# Patient Record
Sex: Female | Born: 1970 | Race: White | Hispanic: No | Smoking: Never smoker
Health system: Southern US, Community
[De-identification: ages and names within clinical notes are randomized; demographics above are authoritative.]

## PROBLEM LIST (undated history)

## (undated) DIAGNOSIS — E7800 Pure hypercholesterolemia, unspecified: Secondary | ICD-10-CM

## (undated) DIAGNOSIS — T7840XA Allergy, unspecified, initial encounter: Secondary | ICD-10-CM

## (undated) DIAGNOSIS — N6019 Diffuse cystic mastopathy of unspecified breast: Secondary | ICD-10-CM

## (undated) DIAGNOSIS — Z87898 Personal history of other specified conditions: Secondary | ICD-10-CM

## (undated) HISTORY — DX: Pure hypercholesterolemia, unspecified: E78.00

## (undated) HISTORY — DX: Diffuse cystic mastopathy of unspecified breast: N60.19

## (undated) HISTORY — DX: Allergy, unspecified, initial encounter: T78.40XA

## (undated) HISTORY — DX: Personal history of other specified conditions: Z87.898

---

## 2005-10-13 ENCOUNTER — Ambulatory Visit: Payer: Self-pay | Admitting: Obstetrics and Gynecology

## 2009-11-19 LAB — HM HIV SCREENING LAB: HM HIV Screening: NEGATIVE

## 2010-09-30 ENCOUNTER — Ambulatory Visit: Payer: Self-pay | Admitting: Obstetrics and Gynecology

## 2011-06-05 LAB — HEPATIC FUNCTION PANEL
Alkaline Phosphatase: 49 U/L (ref 25–125)
Bilirubin, Total: 0.6 mg/dL

## 2011-06-05 LAB — TSH: TSH: 2.21 u[IU]/mL (ref 0.41–5.90)

## 2011-06-05 LAB — BASIC METABOLIC PANEL
Creatinine: 0.8 mg/dL (ref 0.5–1.1)
Glucose: 85 mg/dL

## 2011-11-03 ENCOUNTER — Ambulatory Visit: Payer: Self-pay | Admitting: Obstetrics and Gynecology

## 2011-11-06 ENCOUNTER — Ambulatory Visit: Payer: Self-pay | Admitting: Obstetrics and Gynecology

## 2012-08-19 ENCOUNTER — Encounter: Payer: Self-pay | Admitting: *Deleted

## 2012-08-23 ENCOUNTER — Encounter: Payer: Self-pay | Admitting: Internal Medicine

## 2012-08-23 ENCOUNTER — Ambulatory Visit (INDEPENDENT_AMBULATORY_CARE_PROVIDER_SITE_OTHER): Payer: BC Managed Care – PPO | Admitting: Internal Medicine

## 2012-08-23 VITALS — BP 130/78 | HR 91 | Temp 98.2°F | Resp 18 | Ht 64.0 in | Wt 145.8 lb

## 2012-08-23 DIAGNOSIS — Z13220 Encounter for screening for lipoid disorders: Secondary | ICD-10-CM

## 2012-08-23 DIAGNOSIS — Z87898 Personal history of other specified conditions: Secondary | ICD-10-CM

## 2012-08-23 DIAGNOSIS — Z8742 Personal history of other diseases of the female genital tract: Secondary | ICD-10-CM

## 2012-08-23 DIAGNOSIS — R5381 Other malaise: Secondary | ICD-10-CM

## 2012-08-23 DIAGNOSIS — R5383 Other fatigue: Secondary | ICD-10-CM

## 2012-08-23 DIAGNOSIS — E7800 Pure hypercholesterolemia, unspecified: Secondary | ICD-10-CM

## 2012-08-23 LAB — CBC WITH DIFFERENTIAL/PLATELET
Basophils Absolute: 0 10*3/uL (ref 0.0–0.1)
Hemoglobin: 13.9 g/dL (ref 12.0–15.0)
Lymphocytes Relative: 17.6 % (ref 12.0–46.0)
Monocytes Relative: 5.1 % (ref 3.0–12.0)
Neutro Abs: 4.9 10*3/uL (ref 1.4–7.7)
Platelets: 265 10*3/uL (ref 150.0–400.0)
RDW: 13 % (ref 11.5–14.6)
WBC: 6.6 10*3/uL (ref 4.5–10.5)

## 2012-08-23 LAB — COMPREHENSIVE METABOLIC PANEL
ALT: 13 U/L (ref 0–35)
Albumin: 4.5 g/dL (ref 3.5–5.2)
CO2: 26 mEq/L (ref 19–32)
Calcium: 8.9 mg/dL (ref 8.4–10.5)
Chloride: 102 mEq/L (ref 96–112)
GFR: 98.85 mL/min (ref >60.00)
Sodium: 136 mEq/L (ref 135–145)
Total Protein: 7.1 g/dL (ref 6.0–8.3)

## 2012-08-23 LAB — LIPID PANEL
Cholesterol: 189 mg/dL (ref 0–200)
HDL: 56.4 mg/dL (ref >39.00)
VLDL: 11 mg/dL (ref 0.0–40.0)

## 2012-08-25 ENCOUNTER — Encounter: Payer: Self-pay | Admitting: Internal Medicine

## 2012-08-25 DIAGNOSIS — E7800 Pure hypercholesterolemia, unspecified: Secondary | ICD-10-CM | POA: Insufficient documentation

## 2012-08-25 DIAGNOSIS — R87619 Unspecified abnormal cytological findings in specimens from cervix uteri: Secondary | ICD-10-CM | POA: Insufficient documentation

## 2012-08-25 DIAGNOSIS — N6019 Diffuse cystic mastopathy of unspecified breast: Secondary | ICD-10-CM | POA: Insufficient documentation

## 2012-08-25 NOTE — Progress Notes (Addendum)
  Subjective:    Patient ID: Suzanne Garrison, female    DOB: [birth year], 42 y.o.   MRN: [id number]  HPI 42 year old female with past history of fibrocystic breast disease, abnormal pap (followed by Dr Logan Bores) and hypercholesterolemia.  She comes in today to follow up on these issues as well as to establish care.  She is seeing Dr Logan Bores.  Has an IUD in place.  Has a history of positive HPV.  Just had a repeat pap smear.  Waiting for results.  Has her mammogram scheduled for 6/14.  Had follow up views on previous mammo and reports everything checked out fine.  Was told she had fibrocystic breast disease.  Having regular periods.  No abdominal pain or cramping.  Bowels stable.  Breathing stable.  Overall she feels she is doing well.     Past Medical History  Diagnosis Date  . History of abnormal Pap smear   . Fibrocystic breast disease   . Hypercholesterolemia     Review of Systems Patient denies any headache, lightheadedness or dizziness.  No sinus or allergy symptoms.  No chest pain, tightness or palpitations.  No increased shortness of breath, cough or congestion.  No nausea or vomiting.  No acid reflux.   No abdominal pain or cramping.  No bowel change, such as diarrhea, constipation, BRBPR or melana.  No urine change.   Was told she had increased cholesterol previously.  States this was when she had gained a lot of weight (was up to 183 pounds).  She has noticed around her cycle that she is more sensitive.  More emotional.  Will resolve.       Objective:   Physical Exam Filed Vitals:   08/23/12 0821  BP: 130/78  Pulse: 91  Temp: 98.2 F (36.8 C)  Resp: 77   42 year old female in no acute distress.   HEENT:  Nares- clear.  Oropharynx - without lesions. NECK:  Supple.  Nontender.  No audible bruit.  HEART:  Appears to be regular. LUNGS:  No crackles or wheezing audible.  Respirations even and unlabored.  RADIAL PULSE:  Equal bilaterally.   ABDOMEN:  Soft, nontender.  Bowel sounds  present and normal.  No audible abdominal bruit.  EXTREMITIES:  No increased edema present.  DP pulses palpable and equal bilaterally.          Assessment & Plan:  GYN.  Gets her pap smears through Dr Logan Bores office.  Obtain records.  More emotional around her cycle.  Discussed at length with her today.  Discussed SSRI.  She wants to monitor for now.  Check cbc,met c and tsh.  Follow closely.    HEALTH MAINTENANCE.  Gets her breast, pelvic and pap smears through GYN.  Is scheduled for her mammogram 6/14.      ADDENDUM.  Pt called back 09/04/12 and notified me that her repeat pap was ok and negative HPV.

## 2012-08-25 NOTE — Assessment & Plan Note (Signed)
Low cholesterol diet and exercise.  Check lipid panel.   

## 2012-08-25 NOTE — Assessment & Plan Note (Signed)
Followed by Dr Logan Bores (GYN).  Just had repeat pap.  Obtain results.

## 2012-08-25 NOTE — Assessment & Plan Note (Signed)
Scheduled for her mammogram 6/14.  Had a previous abnormal mammo that required follow up views.  Reviewed by Dr Michela Pitcher.  Obtain records.  Per pt report, felt everything ok.

## 2012-08-27 ENCOUNTER — Encounter: Payer: Self-pay | Admitting: Internal Medicine

## 2012-09-03 ENCOUNTER — Encounter: Payer: Self-pay | Admitting: Internal Medicine

## 2012-09-09 ENCOUNTER — Encounter: Payer: Self-pay | Admitting: Internal Medicine

## 2012-12-04 ENCOUNTER — Ambulatory Visit: Payer: Self-pay | Admitting: Obstetrics and Gynecology

## 2013-03-27 ENCOUNTER — Other Ambulatory Visit: Payer: Self-pay

## 2013-08-29 ENCOUNTER — Encounter: Payer: Self-pay | Admitting: Internal Medicine

## 2013-08-29 ENCOUNTER — Ambulatory Visit (INDEPENDENT_AMBULATORY_CARE_PROVIDER_SITE_OTHER): Payer: BC Managed Care – PPO | Admitting: Internal Medicine

## 2013-08-29 VITALS — BP 118/74 | HR 77 | Temp 98.2°F | Ht 62.5 in | Wt 141.0 lb

## 2013-08-29 DIAGNOSIS — B351 Tinea unguium: Secondary | ICD-10-CM

## 2013-08-29 DIAGNOSIS — N6019 Diffuse cystic mastopathy of unspecified breast: Secondary | ICD-10-CM

## 2013-08-29 DIAGNOSIS — E7800 Pure hypercholesterolemia, unspecified: Secondary | ICD-10-CM

## 2013-08-29 LAB — CBC WITH DIFFERENTIAL/PLATELET
BASOS PCT: 0.3 % (ref 0.0–3.0)
Basophils Absolute: 0 10*3/uL (ref 0.0–0.1)
Eosinophils Absolute: 0.1 10*3/uL (ref 0.0–0.7)
Eosinophils Relative: 1.3 % (ref 0.0–5.0)
HEMATOCRIT: 40.8 % (ref 36.0–46.0)
Hemoglobin: 13.9 g/dL (ref 12.0–15.0)
LYMPHS ABS: 1.2 10*3/uL (ref 0.7–4.0)
Lymphocytes Relative: 15.1 % (ref 12.0–46.0)
MCHC: 34.1 g/dL (ref 30.0–36.0)
MCV: 93.1 fl (ref 78.0–100.0)
MONO ABS: 0.4 10*3/uL (ref 0.1–1.0)
Monocytes Relative: 4.8 % (ref 3.0–12.0)
Neutro Abs: 6.3 10*3/uL (ref 1.4–7.7)
Neutrophils Relative %: 78.5 % — ABNORMAL HIGH (ref 43.0–77.0)
Platelets: 238 10*3/uL (ref 150.0–400.0)
RBC: 4.39 Mil/uL (ref 3.87–5.11)
RDW: 13.4 % (ref 11.5–14.6)
WBC: 8 10*3/uL (ref 4.5–10.5)

## 2013-08-29 LAB — LIPID PANEL
CHOLESTEROL: 237 mg/dL — AB (ref 0–200)
HDL: 82 mg/dL (ref >39.00)
LDL Cholesterol: 148 mg/dL — ABNORMAL HIGH (ref 0–99)
Total CHOL/HDL Ratio: 3
Triglycerides: 33 mg/dL (ref 0.0–149.0)
VLDL: 6.6 mg/dL (ref 0.0–40.0)

## 2013-08-29 LAB — COMPREHENSIVE METABOLIC PANEL
ALBUMIN: 4.4 g/dL (ref 3.5–5.2)
ALK PHOS: 54 U/L (ref 39–117)
ALT: 17 U/L (ref 0–35)
AST: 16 U/L (ref 0–37)
BILIRUBIN TOTAL: 1.3 mg/dL — AB (ref 0.3–1.2)
BUN: 23 mg/dL (ref 6–23)
CO2: 25 mEq/L (ref 19–32)
CREATININE: 0.7 mg/dL (ref 0.4–1.2)
Calcium: 9.6 mg/dL (ref 8.4–10.5)
Chloride: 105 mEq/L (ref 96–112)
GFR: 95.18 mL/min (ref >60.00)
GLUCOSE: 78 mg/dL (ref 70–99)
Potassium: 5.1 mEq/L (ref 3.5–5.1)
Sodium: 137 mEq/L (ref 135–145)
Total Protein: 6.9 g/dL (ref 6.0–8.3)

## 2013-08-29 LAB — TSH: TSH: 1.82 u[IU]/mL (ref 0.35–5.50)

## 2013-08-29 NOTE — Progress Notes (Signed)
  Subjective:    Patient ID: Suzanne Garrison, female    DOB: [birth year], 43 y.o.   MRN: [id number]  HPI 43 year old female with past history of fibrocystic breast disease, abnormal pap (followed by Dr Amalia Hailey) and hypercholesterolemia.  She comes in today to follow up on these issues as well as for a complete physical exam.  She has been seeing Dr Amalia Hailey.  Has an IUD in place.  Has a history of positive HPV.  Last pap negative.  Now planning to f/u with with a different gyn.  Had her mammogram 6/14.   No abdominal pain or cramping.  Bowels stable.  Breathing stable. Going to the gym five days per week.  Overall she feels she is doing well.  She does report problems with her left great toenail.  Saw Dr Evorn Gong.  Initially given lamisil.  Improved.  Then the nail changes returned within a few months.  She went back for evaluation and states culture was negative for fungal infection.  She has had worsening of the nail and has now lost half the nail.  Would like to have reevaluated.  Discussed podiatry referral.     Past Medical History  Diagnosis Date  . History of abnormal Pap smear   . Fibrocystic breast disease   . Hypercholesterolemia     Review of Systems Patient denies any headache, lightheadedness or dizziness.  No sinus or allergy symptoms.  No chest pain, tightness or palpitations.  No increased shortness of breath, cough or congestion.  No nausea or vomiting.  No acid reflux.   No abdominal pain or cramping.  No bowel change, such as diarrhea, constipation, BRBPR or melana.  No urine change.     She has noticed around her cycle that she is more sensitive.  More emotional.  Feels this is stable.  Does not feel she needs any intervention at this time.       Objective:   Physical Exam  Filed Vitals:   08/29/13 0828  BP: 118/74  Pulse: 77  Temp: 98.2 F (16.74 C)   43 year old female in no acute distress.   HEENT:  Nares- clear.  Oropharynx - without lesions. NECK:  Supple.  Nontender.  No  audible bruit.  HEART:  Appears to be regular. LUNGS:  No crackles or wheezing audible.  Respirations even and unlabored.  RADIAL PULSE:  Equal bilaterally.    BREASTS: GYN performs.  ABDOMEN:  Soft, nontender.  Bowel sounds present and normal.  No audible abdominal bruit.  GU:  GYN performs.    EXTREMITIES:  No increased edema present.  DP pulses palpable and equal bilaterally.   Left great toe nail thickened.  Nail bed scaly.  Toenail partially gone.        Assessment & Plan:  GYN.  Has been getting her pap smears through Dr Amalia Hailey office.  He has moved.  Plans to get a new gyn.  More emotional around her cycle.  Discussed with her today.  Discussed SSRI.  She wants to monitor for now.  Check cbc,met c and tsh.  Follow closely.    HEALTH MAINTENANCE.  Physical today.  Gets her breast, pelvic and pap smears through GYN.  Had her last mammogram 6/14.  States ok.  Will let me know if she needs me to schedule her next mammogram.

## 2013-08-29 NOTE — Progress Notes (Signed)
Pre visit review using our clinic review tool, if applicable. No additional management support is needed unless otherwise documented below in the visit note. 

## 2013-08-31 ENCOUNTER — Encounter: Payer: Self-pay | Admitting: Internal Medicine

## 2013-08-31 ENCOUNTER — Telehealth: Payer: Self-pay | Admitting: Internal Medicine

## 2013-08-31 DIAGNOSIS — E875 Hyperkalemia: Secondary | ICD-10-CM

## 2013-08-31 NOTE — Telephone Encounter (Signed)
Pt notified of lab results via my chart.  She needs a non fasting lab within the next 2 weeks.  Please schedule and contact her with a lab appt date and time.  Thanks.

## 2013-09-01 ENCOUNTER — Encounter: Payer: Self-pay | Admitting: Internal Medicine

## 2013-09-01 ENCOUNTER — Encounter: Payer: Self-pay | Admitting: Emergency Medicine

## 2013-09-01 DIAGNOSIS — B351 Tinea unguium: Secondary | ICD-10-CM | POA: Insufficient documentation

## 2013-09-01 NOTE — Assessment & Plan Note (Signed)
Appears to have toenail fungus involving the left great toe.  Was treated with lamisil previously.  Returned.  States culture negative for fungus (per Dr Adolphus Birchwoodasher).  Worsening.  Will refer to Dr Al CorpusHyatt for further treatment and evaluation.

## 2013-09-01 NOTE — Assessment & Plan Note (Signed)
PAP through gyn 09/04/12 negative with negative HPV per pt report.  Continue to f/u with gyn.

## 2013-09-01 NOTE — Assessment & Plan Note (Signed)
Last mammogram per report 6/14.  Needs to continue yearly mammograms.  Will notify me if I need to schedule.  Plans to f/u with gyn.

## 2013-09-01 NOTE — Assessment & Plan Note (Signed)
Low cholesterol diet and exercise.  Check lipid panel.   

## 2013-09-01 NOTE — Telephone Encounter (Signed)
Appointment 4/27.  Sent my chart letting pt know about appointment date and time

## 2013-09-15 ENCOUNTER — Ambulatory Visit: Payer: Self-pay | Admitting: Podiatry

## 2013-09-15 ENCOUNTER — Other Ambulatory Visit: Payer: BC Managed Care – PPO

## 2013-09-19 ENCOUNTER — Other Ambulatory Visit (INDEPENDENT_AMBULATORY_CARE_PROVIDER_SITE_OTHER): Payer: BC Managed Care – PPO

## 2013-09-19 DIAGNOSIS — E875 Hyperkalemia: Secondary | ICD-10-CM

## 2013-09-19 DIAGNOSIS — R17 Unspecified jaundice: Secondary | ICD-10-CM

## 2013-09-19 LAB — HEPATIC FUNCTION PANEL
ALT: 26 U/L (ref 0–35)
AST: 16 U/L (ref 0–37)
Albumin: 3.8 g/dL (ref 3.5–5.2)
Alkaline Phosphatase: 44 U/L (ref 39–117)
BILIRUBIN DIRECT: 0.1 mg/dL (ref 0.0–0.3)
TOTAL PROTEIN: 6.4 g/dL (ref 6.0–8.3)
Total Bilirubin: 0.5 mg/dL (ref 0.3–1.2)

## 2013-09-19 LAB — POTASSIUM: Potassium: 4.5 mEq/L (ref 3.5–5.1)

## 2013-09-22 ENCOUNTER — Ambulatory Visit (INDEPENDENT_AMBULATORY_CARE_PROVIDER_SITE_OTHER): Payer: BC Managed Care – PPO | Admitting: Podiatry

## 2013-09-22 ENCOUNTER — Encounter: Payer: Self-pay | Admitting: Podiatry

## 2013-09-22 ENCOUNTER — Encounter: Payer: Self-pay | Admitting: Internal Medicine

## 2013-09-22 VITALS — BP 147/94 | HR 82 | Resp 16 | Ht 62.0 in | Wt 140.0 lb

## 2013-09-22 DIAGNOSIS — B351 Tinea unguium: Secondary | ICD-10-CM

## 2013-09-22 LAB — CBC WITH DIFFERENTIAL/PLATELET
BASOS: 0 %
Basophils Absolute: 0 10*3/uL (ref 0.0–0.2)
EOS: 2 %
Eosinophils Absolute: 0.2 10*3/uL (ref 0.0–0.4)
HCT: 37.2 % (ref 34.0–46.6)
Hemoglobin: 12.8 g/dL (ref 11.1–15.9)
IMMATURE GRANS (ABS): 0 10*3/uL (ref 0.0–0.1)
Immature Granulocytes: 0 %
Lymphocytes Absolute: 1.2 10*3/uL (ref 0.7–3.1)
Lymphs: 17 %
MCH: 31.4 pg (ref 26.6–33.0)
MCHC: 34.4 g/dL (ref 31.5–35.7)
MCV: 91 fL (ref 79–97)
MONOCYTES: 5 %
MONOS ABS: 0.3 10*3/uL (ref 0.1–0.9)
NEUTROS PCT: 76 %
Neutrophils Absolute: 5.4 10*3/uL (ref 1.4–7.0)
RBC: 4.07 x10E6/uL (ref 3.77–5.28)
RDW: 13.2 % (ref 12.3–15.4)
WBC: 7.2 10*3/uL (ref 3.4–10.8)

## 2013-09-22 MED ORDER — TERBINAFINE HCL 250 MG PO TABS: 250.0000 mg | ORAL_TABLET | Freq: Every day | ORAL | Status: DC

## 2013-09-22 NOTE — Progress Notes (Signed)
   Subjective:    Patient ID: Suzanne LeanWendy Spanier, female    DOB: 1971/01/15, 43 y.o.   MRN: [id number]  HPI Comments: SEEMS TO BE AN INFECTION IN MY TOE , LEFT GREAT TOENAIL, WAS SEEN BY [facility] , DR DASHER , WAS ON THE LAMISIL FOR 3 MONTHS LAST YEAR AND IT SEEMED TO CLEAR UP AND NOW IT SEEMS HAVE COME BACK      Review of Systems  All other systems reviewed and are negative.      Objective:   Physical Exam: I have reviewed her past medical history medications allergies surgeries social history and review of systems very pulses are palpable bilateral. Neurologic sensorium is intact per since once the monofilament. Deep tendon reflexes are intact bilateral muscle strength is 5 over 5 dorsiflexors plantar flexors inverters everters all of his musculature is intact. Orthopedic evaluation demonstrates all joints distal to the ankle have a full range of motion without crepitation. Her hallux nail left appears to be thick yellow dystrophic and clinically mycotic. There is some surrounding tinea pedis associated with this.        Assessment & Plan:  Assessment: Onychomycosis hallux right.  Plan: Discussed etiology pathology conservative versus surgical therapies. Because she has had Lamisil before without complication and she has a complete clearance of the dermatophyte I am inclined to provide her with that once again and we'll have to continue occasional therapy with recurrences. I sent her for liver profile and CBC today and wrote her a 30 day prescription of Lamisil 250 mg 1 by mouth daily. Should her blood work come back abnormal I will notify her immediately otherwise I will followup with her one month for her second dose of Lamisil.

## 2013-09-23 LAB — HEPATIC FUNCTION PANEL
ALBUMIN: 4.2 g/dL (ref 3.5–5.5)
ALT: 19 IU/L (ref 0–32)
AST: 15 IU/L (ref 0–40)
Alkaline Phosphatase: 55 IU/L (ref 39–117)
BILIRUBIN DIRECT: 0.13 mg/dL (ref 0.00–0.40)
BILIRUBIN TOTAL: 0.5 mg/dL (ref 0.0–1.2)
Total Protein: 5.9 g/dL — ABNORMAL LOW (ref 6.0–8.5)

## 2013-09-24 ENCOUNTER — Telehealth: Payer: Self-pay | Admitting: *Deleted

## 2013-09-24 NOTE — Telephone Encounter (Signed)
Message copied by Bing ReeGUNN, Gracen Ringwald L on Wed Sep 24, 2013  8:53 AM ------      Message from: Ernestene KielHYATT, MAX T      Created: Tue Sep 23, 2013  7:26 AM       Blood work looks good. Continue with mediation as prescribed. ------

## 2013-09-24 NOTE — Telephone Encounter (Signed)
Left message for Suzanne Garrison that blood work fine ,continue with medication

## 2013-10-27 ENCOUNTER — Ambulatory Visit (INDEPENDENT_AMBULATORY_CARE_PROVIDER_SITE_OTHER): Payer: BC Managed Care – PPO | Admitting: Podiatry

## 2013-10-27 ENCOUNTER — Encounter: Payer: Self-pay | Admitting: Podiatry

## 2013-10-27 DIAGNOSIS — Z79899 Other long term (current) drug therapy: Secondary | ICD-10-CM

## 2013-10-27 MED ORDER — TERBINAFINE HCL 250 MG PO TABS: 250.0000 mg | ORAL_TABLET | Freq: Every day | ORAL | Status: DC

## 2013-10-27 NOTE — Progress Notes (Signed)
She presents today for followup of her Lamisil therapy x1 month. She states that she has had no problems with it. She denies fever chills nausea vomiting muscle aches or pains or rashes.  Objective: Onychomycosis left hallux.  Assessment: Onychomycosis left hallux.  Plan: Send for another liver profile and CBC. Refill her prescription of Lamisil 250 mg x91 by mouth daily. Followup with her in 4 months. We will notify her should her blood work come back abnormal.

## 2013-10-28 LAB — HEPATIC FUNCTION PANEL
ALK PHOS: 60 IU/L (ref 39–117)
ALT: 12 IU/L (ref 0–32)
AST: 13 IU/L (ref 0–40)
Albumin: 4.4 g/dL (ref 3.5–5.5)
BILIRUBIN TOTAL: 0.5 mg/dL (ref 0.0–1.2)
Bilirubin, Direct: 0.14 mg/dL (ref 0.00–0.40)
Total Protein: 6.3 g/dL (ref 6.0–8.5)

## 2013-10-28 LAB — CBC WITH DIFFERENTIAL/PLATELET
BASOS ABS: 0 10*3/uL (ref 0.0–0.2)
Basos: 0 %
EOS: 2 %
Eosinophils Absolute: 0.1 10*3/uL (ref 0.0–0.4)
HCT: 37.6 % (ref 34.0–46.6)
Hemoglobin: 12.8 g/dL (ref 11.1–15.9)
IMMATURE GRANULOCYTES: 0 %
Immature Grans (Abs): 0 10*3/uL (ref 0.0–0.1)
LYMPHS: 20 %
Lymphocytes Absolute: 1.5 10*3/uL (ref 0.7–3.1)
MCH: 31.9 pg (ref 26.6–33.0)
MCHC: 34 g/dL (ref 31.5–35.7)
MCV: 94 fL (ref 79–97)
Monocytes Absolute: 0.4 10*3/uL (ref 0.1–0.9)
Monocytes: 5 %
NEUTROS PCT: 73 %
Neutrophils Absolute: 5.2 10*3/uL (ref 1.4–7.0)
RBC: 4.01 x10E6/uL (ref 3.77–5.28)
RDW: 13.3 % (ref 12.3–15.4)
WBC: 7.2 10*3/uL (ref 3.4–10.8)

## 2013-10-29 ENCOUNTER — Telehealth: Payer: Self-pay | Admitting: *Deleted

## 2013-10-29 NOTE — Telephone Encounter (Signed)
Message copied by Bing Ree on Wed Oct 29, 2013  3:47 PM ------      Message from: HYATT, MAX T      Created: Tue Oct 28, 2013  1:20 PM       Blood work looks good. Continue medication. ------

## 2013-10-29 NOTE — Telephone Encounter (Signed)
Spoke to [employer] , lab work was good , continue with medication

## 2013-10-29 NOTE — Telephone Encounter (Signed)
Left message regarding her labs

## 2014-02-09 ENCOUNTER — Ambulatory Visit: Payer: Self-pay | Admitting: Internal Medicine

## 2014-02-09 LAB — HM MAMMOGRAPHY: HM Mammogram: NEGATIVE

## 2014-02-10 ENCOUNTER — Encounter: Payer: Self-pay | Admitting: *Deleted

## 2014-02-10 LAB — HM PAP SMEAR: HM Pap smear: NEGATIVE

## 2014-02-23 ENCOUNTER — Ambulatory Visit: Payer: BC Managed Care – PPO | Admitting: Podiatry

## 2014-04-07 ENCOUNTER — Encounter: Payer: Self-pay | Admitting: Internal Medicine

## 2014-04-15 ENCOUNTER — Ambulatory Visit: Payer: BC Managed Care – PPO | Admitting: Podiatry

## 2014-05-18 ENCOUNTER — Ambulatory Visit (INDEPENDENT_AMBULATORY_CARE_PROVIDER_SITE_OTHER): Payer: BC Managed Care – PPO | Admitting: Podiatry

## 2014-05-18 VITALS — BP 140/90 | HR 84 | Resp 16

## 2014-05-18 DIAGNOSIS — B351 Tinea unguium: Secondary | ICD-10-CM

## 2014-05-18 DIAGNOSIS — Z79899 Other long term (current) drug therapy: Secondary | ICD-10-CM

## 2014-05-18 MED ORDER — TERBINAFINE HCL 250 MG PO TABS: ORAL_TABLET | ORAL | Status: DC

## 2014-05-18 NOTE — Progress Notes (Signed)
She presents today for follow-up of Lamisil. She states that it is almost grown out completely.  Objective: Vital signs are stable she is alert and oriented 3 nail plate hallux left demonstrates 95% improvement.  Assessment: Well-healing onychomycosis hallux left.  Plan: We will continue Lamisil 250 mg tablets 1 by mouth every other day 30 tablets. I will follow-up with her in 3 months.

## 2014-08-17 ENCOUNTER — Ambulatory Visit: Payer: BC Managed Care – PPO | Admitting: Podiatry

## 2014-09-04 ENCOUNTER — Ambulatory Visit (INDEPENDENT_AMBULATORY_CARE_PROVIDER_SITE_OTHER): Payer: BLUE CROSS/BLUE SHIELD | Admitting: Internal Medicine

## 2014-09-04 ENCOUNTER — Encounter: Payer: Self-pay | Admitting: Internal Medicine

## 2014-09-04 VITALS — BP 130/80 | HR 81 | Temp 98.2°F | Ht 62.0 in | Wt 132.1 lb

## 2014-09-04 DIAGNOSIS — R87619 Unspecified abnormal cytological findings in specimens from cervix uteri: Secondary | ICD-10-CM

## 2014-09-04 DIAGNOSIS — E7800 Pure hypercholesterolemia, unspecified: Secondary | ICD-10-CM

## 2014-09-04 DIAGNOSIS — B351 Tinea unguium: Secondary | ICD-10-CM

## 2014-09-04 DIAGNOSIS — Z0000 Encounter for general adult medical examination without abnormal findings: Secondary | ICD-10-CM

## 2014-09-04 DIAGNOSIS — N6019 Diffuse cystic mastopathy of unspecified breast: Secondary | ICD-10-CM

## 2014-09-04 LAB — CBC WITH DIFFERENTIAL/PLATELET
BASOS PCT: 0.5 % (ref 0.0–3.0)
Basophils Absolute: 0 10*3/uL (ref 0.0–0.1)
EOS PCT: 2.8 % (ref 0.0–5.0)
Eosinophils Absolute: 0.2 10*3/uL (ref 0.0–0.7)
HCT: 37.9 % (ref 36.0–46.0)
HEMOGLOBIN: 13.2 g/dL (ref 12.0–15.0)
Lymphocytes Relative: 16.3 % (ref 12.0–46.0)
Lymphs Abs: 1 10*3/uL (ref 0.7–4.0)
MCHC: 34.9 g/dL (ref 30.0–36.0)
MCV: 91.6 fl (ref 78.0–100.0)
MONOS PCT: 5.3 % (ref 3.0–12.0)
Monocytes Absolute: 0.3 10*3/uL (ref 0.1–1.0)
NEUTROS ABS: 4.8 10*3/uL (ref 1.4–7.7)
NEUTROS PCT: 75.1 % (ref 43.0–77.0)
Platelets: 240 10*3/uL (ref 150.0–400.0)
RBC: 4.14 Mil/uL (ref 3.87–5.11)
RDW: 12.8 % (ref 11.5–15.5)
WBC: 6.4 10*3/uL (ref 4.0–10.5)

## 2014-09-04 LAB — HEPATIC FUNCTION PANEL
ALT: 11 U/L (ref 0–35)
AST: 10 U/L (ref 0–37)
Albumin: 4.4 g/dL (ref 3.5–5.2)
Alkaline Phosphatase: 50 U/L (ref 39–117)
Bilirubin, Direct: 0.1 mg/dL (ref 0.0–0.3)
TOTAL PROTEIN: 6.9 g/dL (ref 6.0–8.3)
Total Bilirubin: 0.8 mg/dL (ref 0.2–1.2)

## 2014-09-04 LAB — BASIC METABOLIC PANEL
BUN: 17 mg/dL (ref 6–23)
CALCIUM: 9.3 mg/dL (ref 8.4–10.5)
CO2: 28 meq/L (ref 19–32)
CREATININE: 0.76 mg/dL (ref 0.40–1.20)
Chloride: 104 mEq/L (ref 96–112)
GFR: 87.58 mL/min (ref >60.00)
Glucose, Bld: 97 mg/dL (ref 70–99)
Potassium: 4.5 mEq/L (ref 3.5–5.1)
Sodium: 137 mEq/L (ref 135–145)

## 2014-09-04 LAB — LIPID PANEL
CHOL/HDL RATIO: 3
Cholesterol: 195 mg/dL (ref 0–200)
HDL: 71.7 mg/dL (ref >39.00)
LDL CALC: 111 mg/dL — AB (ref 0–99)
NONHDL: 123.3
Triglycerides: 63 mg/dL (ref 0.0–149.0)
VLDL: 12.6 mg/dL (ref 0.0–40.0)

## 2014-09-04 LAB — TSH: TSH: 2.22 u[IU]/mL (ref 0.35–4.50)

## 2014-09-04 NOTE — Progress Notes (Signed)
Patient ID: Suzanne Garrison, female   DOB: 1970/12/31, 44 y.o.   MRN: [id number]   Subjective:    Patient ID: Suzanne Garrison, female    DOB: 04-Nov-1970, 44 y.o.   MRN: [id number]  HPI  Patient here for her physical exam.  Sees gyn for her breast, pelvic and pap smears.  States had pap 01/2014 - Dr Toya Smothers.  She prescribed phentermine.  She takes this prn.  Wants hepatitis titer checked.  Trying to watch her diet.  Discussed exercise.  No vaginal spotting.  Has IUD.     Past Medical History  Diagnosis Date  . History of abnormal Pap smear   . Fibrocystic breast disease   . Hypercholesterolemia     Current Outpatient Prescriptions on File Prior to Visit  Medication Sig Dispense Refill  . terbinafine (LAMISIL) 250 MG tablet Take 1 tablet (250 mg total) by mouth daily. 90 tablet 0   No current facility-administered medications on file prior to visit.    Review of Systems  Constitutional: Negative for appetite change and unexpected weight change.  HENT: Negative for congestion and sinus pressure.   Eyes: Negative for pain and visual disturbance.  Respiratory: Negative for cough, chest tightness and shortness of breath.   Cardiovascular: Negative for chest pain, palpitations and leg swelling.  Gastrointestinal: Negative for nausea, vomiting, abdominal pain and diarrhea.  Genitourinary: Negative for frequency and difficulty urinating.  Musculoskeletal: Negative for back pain and joint swelling.  Skin: Negative for color change and rash.  Neurological: Negative for dizziness, light-headedness and headaches.  Hematological: Negative for adenopathy. Does not bruise/bleed easily.  Psychiatric/Behavioral: Negative for dysphoric mood and agitation.       Objective:    Physical Exam  BP 130/80 mmHg  Pulse 81  Temp(Src) 98.2 F (36.8 C) (Oral)  Ht  (1.575 m)  Wt 132 lb 2 oz (59.932 kg)  BMI 24.16 kg/m2  SpO2 98%  LMP 09/01/2014 Wt Readings from Last 3 Encounters:    09/04/14 132 lb 2 oz (59.932 kg)  09/22/13 140 lb (63.504 kg)  08/29/13 141 lb (63.957 kg)     Lab Results  Component Value Date   WBC 6.4 09/04/2014   HGB 13.2 09/04/2014   HCT 37.9 09/04/2014   PLT 240.0 09/04/2014   GLUCOSE 97 09/04/2014   CHOL 195 09/04/2014   TRIG 63.0 09/04/2014   HDL 71.70 09/04/2014   LDLCALC 111* 09/04/2014   ALT 11 09/04/2014   AST 10 09/04/2014   NA 137 09/04/2014   K 4.5 09/04/2014   CL 104 09/04/2014   CREATININE 0.76 09/04/2014   BUN 17 09/04/2014   CO2 28 09/04/2014   TSH 2.22 09/04/2014       Assessment & Plan:   Problem List Items Addressed This Visit    Abnormal Pap smear of cervix    PAP through gyn 09/04/12 - negative with negative HPV.  States up to date with pap.  Had last 01/2014.  Obtain results.        Fibrocystic breast disease    Mammogram 02/09/14 - Birads I.        Health care maintenance    PAP 01/2014 - through gyn.  Obtain results.  Mammogram 02/09/14 - Birads I.        Hypercholesterolemia - Primary    Low cholesterol diet and exercise.  Follow lipid panel.        Relevant Orders   CBC with Differential/Platelet (Completed)   Lipid  panel (Completed)   TSH (Completed)   Basic metabolic panel (Completed)   Toenail fungus   Relevant Orders   Hepatic function panel (Completed)    Other Visit Diagnoses    Routine general medical examination at a health care facility        Relevant Orders    Hepatitis B Surface AntiBODY (Completed)      I spent 25 minutes with the patient and more than 50% of the time was spent in consultation regarding the above.     Suzanne DurhamSCOTT, Suzanne Degroote, MD

## 2014-09-04 NOTE — Progress Notes (Signed)
Pre visit review using our clinic review tool, if applicable. No additional management support is needed unless otherwise documented below in the visit note. 

## 2014-09-05 ENCOUNTER — Encounter: Payer: Self-pay | Admitting: Internal Medicine

## 2014-09-05 LAB — HEPATITIS B SURFACE ANTIBODY,QUALITATIVE: Hep B S Ab: POSITIVE — AB

## 2014-09-08 NOTE — Telephone Encounter (Signed)
Unread mychart message mailed to patient 

## 2014-09-10 ENCOUNTER — Encounter: Payer: Self-pay | Admitting: *Deleted

## 2014-09-13 DIAGNOSIS — Z0000 Encounter for general adult medical examination without abnormal findings: Secondary | ICD-10-CM | POA: Insufficient documentation

## 2014-09-13 NOTE — Assessment & Plan Note (Signed)
PAP through gyn 09/04/12 - negative with negative HPV.  States up to date with pap.  Had last 01/2014.  Obtain results.

## 2014-09-13 NOTE — Assessment & Plan Note (Signed)
Low cholesterol diet and exercise.  Follow lipid panel.   

## 2014-09-13 NOTE — Assessment & Plan Note (Signed)
Mammogram 02/09/14 - Birads I.

## 2014-09-13 NOTE — Assessment & Plan Note (Signed)
PAP 01/2014 - through gyn.  Obtain results.  Mammogram 02/09/14 - Birads I.

## 2014-11-12 ENCOUNTER — Encounter: Payer: Self-pay | Admitting: Internal Medicine

## 2014-11-16 ENCOUNTER — Ambulatory Visit (INDEPENDENT_AMBULATORY_CARE_PROVIDER_SITE_OTHER): Payer: BLUE CROSS/BLUE SHIELD | Admitting: Podiatry

## 2014-11-16 DIAGNOSIS — B351 Tinea unguium: Secondary | ICD-10-CM

## 2014-11-16 DIAGNOSIS — Z79899 Other long term (current) drug therapy: Secondary | ICD-10-CM | POA: Diagnosis not present

## 2014-11-16 MED ORDER — TERBINAFINE HCL 250 MG PO TABS: 250.0000 mg | ORAL_TABLET | Freq: Every day | ORAL | Status: DC

## 2014-11-16 NOTE — Progress Notes (Signed)
She presents today for follow-up of Lamisil. She states that it is almost grown out completely and more of healthy nail is seen.  Objective: Vital signs are stable she is alert and oriented 3 well healed nail plate hallux   Assessment: Well-healing onychomycosis hallux left.  Plan: We will continue Lamisil 250 mg tablets 1 by mouth every other day 30 tablets. I will follow-up with her as needed.

## 2014-11-18 ENCOUNTER — Ambulatory Visit: Payer: BLUE CROSS/BLUE SHIELD | Admitting: Internal Medicine

## 2015-03-05 ENCOUNTER — Other Ambulatory Visit: Payer: Self-pay | Admitting: Obstetrics and Gynecology

## 2015-03-05 DIAGNOSIS — Z1231 Encounter for screening mammogram for malignant neoplasm of breast: Secondary | ICD-10-CM

## 2015-04-19 ENCOUNTER — Ambulatory Visit: Payer: BLUE CROSS/BLUE SHIELD | Attending: Obstetrics and Gynecology

## 2015-05-20 ENCOUNTER — Ambulatory Visit
Admission: RE | Admit: 2015-05-20 | Discharge: 2015-05-20 | Disposition: A | Discharge status: Home / Self Care | Payer: BLUE CROSS/BLUE SHIELD | Source: Ambulatory Visit | Attending: Obstetrics and Gynecology | Admitting: Obstetrics and Gynecology

## 2015-05-20 DIAGNOSIS — Z1231 Encounter for screening mammogram for malignant neoplasm of breast: Secondary | ICD-10-CM | POA: Insufficient documentation

## 2015-09-10 ENCOUNTER — Encounter: Payer: Self-pay | Admitting: Internal Medicine

## 2015-09-10 ENCOUNTER — Ambulatory Visit (INDEPENDENT_AMBULATORY_CARE_PROVIDER_SITE_OTHER): Payer: BLUE CROSS/BLUE SHIELD | Admitting: Internal Medicine

## 2015-09-10 VITALS — BP 122/82 | HR 76 | Temp 98.6°F | Resp 18 | Ht 62.0 in | Wt 142.4 lb

## 2015-09-10 DIAGNOSIS — B351 Tinea unguium: Secondary | ICD-10-CM

## 2015-09-10 DIAGNOSIS — R87619 Unspecified abnormal cytological findings in specimens from cervix uteri: Secondary | ICD-10-CM | POA: Diagnosis not present

## 2015-09-10 DIAGNOSIS — E7800 Pure hypercholesterolemia, unspecified: Secondary | ICD-10-CM

## 2015-09-10 DIAGNOSIS — R05 Cough: Secondary | ICD-10-CM

## 2015-09-10 DIAGNOSIS — Z0000 Encounter for general adult medical examination without abnormal findings: Secondary | ICD-10-CM | POA: Diagnosis not present

## 2015-09-10 DIAGNOSIS — R059 Cough, unspecified: Secondary | ICD-10-CM

## 2015-09-10 LAB — CBC WITH DIFFERENTIAL/PLATELET
BASOS PCT: 0.7 % (ref 0.0–3.0)
Basophils Absolute: 0.1 10*3/uL (ref 0.0–0.1)
EOS ABS: 0.3 10*3/uL (ref 0.0–0.7)
EOS PCT: 3.9 % (ref 0.0–5.0)
HEMATOCRIT: 39 % (ref 36.0–46.0)
HEMOGLOBIN: 13.3 g/dL (ref 12.0–15.0)
LYMPHS PCT: 17.9 % (ref 12.0–46.0)
Lymphs Abs: 1.4 10*3/uL (ref 0.7–4.0)
MCHC: 34.2 g/dL (ref 30.0–36.0)
MCV: 92.7 fl (ref 78.0–100.0)
MONO ABS: 0.5 10*3/uL (ref 0.1–1.0)
Monocytes Relative: 7 % (ref 3.0–12.0)
Neutro Abs: 5.4 10*3/uL (ref 1.4–7.7)
Neutrophils Relative %: 70.5 % (ref 43.0–77.0)
Platelets: 258 10*3/uL (ref 150.0–400.0)
RBC: 4.2 Mil/uL (ref 3.87–5.11)
RDW: 13.4 % (ref 11.5–15.5)
WBC: 7.6 10*3/uL (ref 4.0–10.5)

## 2015-09-10 LAB — COMPREHENSIVE METABOLIC PANEL
ALBUMIN: 4.2 g/dL (ref 3.5–5.2)
ALT: 26 U/L (ref 0–35)
AST: 13 U/L (ref 0–37)
Alkaline Phosphatase: 47 U/L (ref 39–117)
BILIRUBIN TOTAL: 0.6 mg/dL (ref 0.2–1.2)
BUN: 18 mg/dL (ref 6–23)
CALCIUM: 9.4 mg/dL (ref 8.4–10.5)
CO2: 27 mEq/L (ref 19–32)
CREATININE: 0.75 mg/dL (ref 0.40–1.20)
Chloride: 105 mEq/L (ref 96–112)
GFR: 88.53 mL/min (ref >60.00)
Glucose, Bld: 84 mg/dL (ref 70–99)
Potassium: 4.3 mEq/L (ref 3.5–5.1)
Sodium: 139 mEq/L (ref 135–145)
Total Protein: 6.6 g/dL (ref 6.0–8.3)

## 2015-09-10 LAB — LIPID PANEL
Cholesterol: 193 mg/dL (ref 0–200)
HDL: 89.8 mg/dL (ref >39.00)
LDL Cholesterol: 96 mg/dL (ref 0–99)
NonHDL: 103.22
TRIGLYCERIDES: 36 mg/dL (ref 0.0–149.0)
Total CHOL/HDL Ratio: 2
VLDL: 7.2 mg/dL (ref 0.0–40.0)

## 2015-09-10 LAB — TSH: TSH: 2.09 u[IU]/mL (ref 0.35–4.50)

## 2015-09-10 NOTE — Progress Notes (Signed)
Patient ID: Suzanne Garrison, female   DOB: 07-Apr-1971, 45 y.o.   MRN: [id number]   Subjective:    Patient ID: Suzanne QuanWendy E Garrison, female    DOB: 07-Apr-1971, 45 y.o.   MRN: [id number]  HPI  Patient here for a physical exam.  She sees gyn.  Had pap one month ago.  Ok.  Mammogram 05/20/15 - birads I.  She reports increased stress recently.  Has moved in with her mother-n-law.  Mother-n-law was recently diagnosed with pancreatic cancer.  Increased stress related to this.  Feels she is handling things relatively well.  She does report sore throat and some congestion and cough.  Just started.  Not bad.  No sob.  No fever.  No acid reflux.  No abdominal pain or cramping.  Bowels stable.     Past Medical History  Diagnosis Date  . History of abnormal Pap smear   . Fibrocystic breast disease   . Hypercholesterolemia    No past surgical history on file. Family History  Problem Relation Age of Onset  . Thyroid disease Maternal Grandmother   . Alcohol abuse Paternal Grandfather   . Cancer Paternal Grandfather     lung  . Thyroid disease Maternal Grandfather   . Colon cancer Neg Hx   . Breast cancer Neg Hx    Social History   Social History  . Marital Status: Divorced    Spouse Name: N/A  . Number of Children: 0  . Years of Education: N/A   Occupational History  .     Social History Main Topics  . Smoking status: Never Smoker   . Smokeless tobacco: Never Used  . Alcohol Use: 0.6 oz/week    1 Shots of liquor per week     Comment: occasional  . Drug Use: No  . Sexual Activity: Not Asked   Other Topics Concern  . None   Social History Narrative    Outpatient Encounter Prescriptions as of 09/10/2015  Medication Sig  . phentermine 37.5 MG capsule Take 37.5 mg by mouth daily as needed.  . [DISCONTINUED] terbinafine (LAMISIL) 250 MG tablet Take 1 tablet (250 mg total) by mouth daily.   No facility-administered encounter medications on file as of 09/10/2015.    Review of Systems    Constitutional: Negative for appetite change and unexpected weight change.  HENT: Positive for congestion, postnasal drip and sore throat. Negative for sinus pressure.   Eyes: Negative for pain and visual disturbance.  Respiratory: Positive for cough. Negative for chest tightness and shortness of breath.   Cardiovascular: Negative for chest pain, palpitations and leg swelling.  Gastrointestinal: Negative for nausea, vomiting, abdominal pain and diarrhea.  Genitourinary: Negative for dysuria and difficulty urinating.  Musculoskeletal: Negative for back pain and joint swelling.  Skin: Negative for color change and rash.  Neurological: Negative for dizziness, light-headedness and headaches.  Hematological: Negative for adenopathy. Does not bruise/bleed easily.  Psychiatric/Behavioral: Negative for dysphoric mood and agitation.       Objective:    Physical Exam  Constitutional: She appears well-developed and well-nourished. No distress.  HENT:  Nose: Nose normal.  Mouth/Throat: Oropharynx is clear and moist.  Neck: Neck supple. No thyromegaly present.  Cardiovascular: Normal rate and regular rhythm.   Pulmonary/Chest: Breath sounds normal. No respiratory distress. She has no wheezes.  Breast exam through gyn.   Abdominal: Soft. Bowel sounds are normal. There is no tenderness.  Genitourinary:  GU exam - through gyn.   Musculoskeletal: She exhibits no  edema or tenderness.  Lymphadenopathy:    She has no cervical adenopathy.  Skin: No rash noted. No erythema.  Psychiatric: She has a normal mood and affect. Her behavior is normal.    BP 122/82 mmHg  Pulse 76  Temp(Src) 98.6 F (37 C) (Oral)  Resp 18  Ht  (1.575 m)  Wt 142 lb 6 oz (64.581 kg)  BMI 26.03 kg/m2  SpO2 98%  LMP 08/20/2015 (Exact Date) Wt Readings from Last 3 Encounters:  09/10/15 142 lb 6 oz (64.581 kg)  09/04/14 132 lb 2 oz (59.932 kg)  09/22/13 140 lb (63.504 kg)     Lab Results  Component Value Date    WBC 7.6 09/10/2015   HGB 13.3 09/10/2015   HCT 39.0 09/10/2015   PLT 258.0 09/10/2015   GLUCOSE 84 09/10/2015   CHOL 193 09/10/2015   TRIG 36.0 09/10/2015   HDL 89.80 09/10/2015   LDLCALC 96 09/10/2015   ALT 26 09/10/2015   AST 13 09/10/2015   NA 139 09/10/2015   K 4.3 09/10/2015   CL 105 09/10/2015   CREATININE 0.75 09/10/2015   BUN 18 09/10/2015   CO2 27 09/10/2015   TSH 2.09 09/10/2015    Mm Digital Screening Bilateral  05/20/2015  CLINICAL DATA:  Screening. EXAM: DIGITAL SCREENING BILATERAL MAMMOGRAM WITH CAD COMPARISON:  Previous exam(s). ACR Breast Density Category b: There are scattered areas of fibroglandular density. FINDINGS: There are no findings suspicious for malignancy. Images were processed with CAD. IMPRESSION: No mammographic evidence of malignancy. A result letter of this screening mammogram will be mailed directly to the patient. RECOMMENDATION: Screening mammogram in one year. (Code:SM-B-01Y) BI-RADS CATEGORY  1: Negative. Electronically Signed   By: Harmon Pier M.D.   On: 05/20/2015 13:12       Assessment & Plan:   Problem List Items Addressed This Visit    Abnormal Pap smear of cervix    Seeing gyn.  Just checked one month ago and ok.  Continue f/u with gyn.       Cough    Just started.  Possibly viral vs allergies.  Robitussin DM as directed.  Saline nasal spray and nasacort nasal spray as directed.  Follow.       Health care maintenance    PAP performed by gyn one month ago.  Ok.  Mammogram 05/20/15 - birads I.  Physical here today.        Hypercholesterolemia - Primary    Low cholesterol diet and exercise.  Follow lipid panel.        Relevant Orders   Lipid panel (Completed)   TSH (Completed)   Comprehensive metabolic panel (Completed)   CBC with Differential/Platelet (Completed)   Toenail fungus    Saw podiatry.  Off lamisil.            Dale , MD

## 2015-09-10 NOTE — Progress Notes (Signed)
Pre-visit discussion using our clinic review tool. No additional management support is needed unless otherwise documented below in the visit note.  

## 2015-09-10 NOTE — Patient Instructions (Signed)
Saline nasal spray - flush nose at least 2-3x/day  nasacort nasal spray - 2 sprays each nostril one time per day.  Do this in the evening.    Robitussin DM twice a day as needed.  

## 2015-09-12 ENCOUNTER — Encounter: Payer: Self-pay | Admitting: Internal Medicine

## 2015-09-12 DIAGNOSIS — R05 Cough: Secondary | ICD-10-CM | POA: Insufficient documentation

## 2015-09-12 DIAGNOSIS — R059 Cough, unspecified: Secondary | ICD-10-CM | POA: Insufficient documentation

## 2015-09-12 NOTE — Assessment & Plan Note (Signed)
Seeing gyn.  Just checked one month ago and ok.  Continue f/u with gyn.

## 2015-09-12 NOTE — Assessment & Plan Note (Signed)
Just started.  Possibly viral vs allergies.  Robitussin DM as directed.  Saline nasal spray and nasacort nasal spray as directed.  Follow.

## 2015-09-12 NOTE — Assessment & Plan Note (Signed)
Low cholesterol diet and exercise.  Follow lipid panel.   

## 2015-09-12 NOTE — Assessment & Plan Note (Signed)
Saw podiatry.  Off lamisil.

## 2015-09-12 NOTE — Assessment & Plan Note (Signed)
PAP performed by gyn one month ago.  Ok.  Mammogram 05/20/15 - birads I.  Physical here today.

## 2015-09-16 NOTE — Telephone Encounter (Signed)
Unread mychart message mailed to patient 

## 2015-10-11 ENCOUNTER — Ambulatory Visit (INDEPENDENT_AMBULATORY_CARE_PROVIDER_SITE_OTHER): Payer: BLUE CROSS/BLUE SHIELD | Admitting: Family Medicine

## 2015-10-11 ENCOUNTER — Encounter: Payer: Self-pay | Admitting: Family Medicine

## 2015-10-11 VITALS — BP 142/88 | HR 73 | Temp 98.6°F | Ht 62.0 in | Wt 144.0 lb

## 2015-10-11 DIAGNOSIS — R05 Cough: Secondary | ICD-10-CM

## 2015-10-11 DIAGNOSIS — R053 Chronic cough: Secondary | ICD-10-CM

## 2015-10-11 MED ORDER — PREDNISONE 50 MG PO TABS: ORAL_TABLET | ORAL | Status: DC

## 2015-10-11 MED ORDER — HYDROCOD POLST-CPM POLST ER 10-8 MG/5ML PO SUER: 5.0000 mL | Freq: Two times a day (BID) | ORAL | Status: DC | PRN

## 2015-10-11 NOTE — Assessment & Plan Note (Signed)
New problem. With 4 weeks of cough. No history of asthma; nonsmoker; no gerd.  Treating empirically with prednisone and tussionex. Patient to call if she fails to improve or worsens.

## 2015-10-11 NOTE — Patient Instructions (Signed)
Take medication as prescribed.  Call if you fail to improve or worsen.  Take care   Dr. Adriana Simasook

## 2015-10-11 NOTE — Progress Notes (Signed)
Pre visit review using our clinic review tool, if applicable. No additional management support is needed unless otherwise documented below in the visit note. 

## 2015-10-11 NOTE — Progress Notes (Signed)
Subjective:  Patient ID: Suzanne Garrison, female    DOB: March 29, 1971  Age: 45 y.o. MRN: [id number]  CC: Ear pain, Cough  HPI:  45 year old female presents with the above complaints.  Cough, ear pain  Patient reports that she's had cough for the past month.  She states it is intermittently productive.  No associated fever or shortness of breath. Additionally, she reports that her right ear is stopped up.  She's taken over-the-counter Robitussin and Delsym as well as some medication with codeine. She states that she's had minimal improvement.  No known exacerbating factors.  Symptoms are mild to moderate in severity.  No other complaints at this time.  Social Hx   Social History   Social History  . Marital Status: Divorced    Spouse Name: N/A  . Number of Children: 0  . Years of Education: N/A   Occupational History  .     Social History Main Topics  . Smoking status: Never Smoker   . Smokeless tobacco: Never Used  . Alcohol Use: 0.6 oz/week    1 Shots of liquor per week     Comment: occasional  . Drug Use: No  . Sexual Activity: Not Asked   Other Topics Concern  . None   Social History Narrative   Review of Systems  Constitutional: Negative for fever.  HENT:       Ear stopped up.  Respiratory: Positive for cough. Negative for shortness of breath.    Objective:  BP 142/88 mmHg  Pulse 73  Temp(Src) 98.6 F (37 C) (Oral)  Ht 5\' 2"  (1.575 m)  Wt 144 lb (65.318 kg)  BMI 26.33 kg/m2  SpO2 97%  BP/Weight 10/11/2015 09/10/2015 09/04/2014  Systolic BP 142 122 130  Diastolic BP 88 82 80  Wt. (Lbs) 144 142.38 132.13  BMI 26.33 26.03 24.16    Physical Exam  Constitutional: She is oriented to person, place, and time. She appears well-developed. No distress.  HENT:  Head: Normocephalic and atraumatic.  Oropharynx with mild erythema. No ectopy. Right TM with effusion. No erythema. Left TM normal.  Eyes: Conjunctivae are normal.  Neck: Neck supple.    Cardiovascular: Normal rate and regular rhythm.   Pulmonary/Chest: Effort normal. She has no wheezes. She has no rales.  Lymphadenopathy:    She has no cervical adenopathy.  Neurological: She is alert and oriented to person, place, and time.  Psychiatric: She has a normal mood and affect.  Vitals reviewed.  Lab Results  Component Value Date   WBC 7.6 09/10/2015   HGB 13.3 09/10/2015   HCT 39.0 09/10/2015   PLT 258.0 09/10/2015   GLUCOSE 84 09/10/2015   CHOL 193 09/10/2015   TRIG 36.0 09/10/2015   HDL 89.80 09/10/2015   LDLCALC 96 09/10/2015   ALT 26 09/10/2015   AST 13 09/10/2015   NA 139 09/10/2015   K 4.3 09/10/2015   CL 105 09/10/2015   CREATININE 0.75 09/10/2015   BUN 18 09/10/2015   CO2 27 09/10/2015   TSH 2.09 09/10/2015   Assessment & Plan:   Problem List Items Addressed This Visit    Chronic cough - Primary    New problem. With 4 weeks of cough. No history of asthma; nonsmoker; no gerd.  Treating empirically with prednisone and tussionex. Patient to call if she fails to improve or worsens.         Meds ordered this encounter  Medications  . predniSONE (DELTASONE) 50 MG tablet  Sig: 1 tablet daily x 5 days.    Dispense:  5 tablet    Refill:  0  . chlorpheniramine-HYDROcodone (TUSSIONEX PENNKINETIC ER) 10-8 MG/5ML SUER    Sig: Take 5 mLs by mouth every 12 (twelve) hours as needed.    Dispense:  115 mL    Refill:  0   Follow-up: PRN  Everlene Other DO [facility]

## 2016-05-08 ENCOUNTER — Other Ambulatory Visit: Payer: Self-pay | Admitting: Internal Medicine

## 2016-05-08 DIAGNOSIS — Z1231 Encounter for screening mammogram for malignant neoplasm of breast: Secondary | ICD-10-CM

## 2016-06-08 ENCOUNTER — Ambulatory Visit: Payer: BLUE CROSS/BLUE SHIELD

## 2016-08-21 LAB — HM PAP SMEAR: HM PAP: NEGATIVE

## 2016-09-15 ENCOUNTER — Ambulatory Visit (INDEPENDENT_AMBULATORY_CARE_PROVIDER_SITE_OTHER): Payer: BLUE CROSS/BLUE SHIELD | Admitting: Internal Medicine

## 2016-09-15 ENCOUNTER — Ambulatory Visit
Admission: RE | Admit: 2016-09-15 | Discharge: 2016-09-15 | Disposition: A | Discharge status: Home / Self Care | Payer: BLUE CROSS/BLUE SHIELD | Source: Ambulatory Visit | Attending: Internal Medicine | Admitting: Internal Medicine

## 2016-09-15 ENCOUNTER — Encounter: Payer: Self-pay | Admitting: Internal Medicine

## 2016-09-15 VITALS — BP 140/82 | HR 87 | Temp 98.6°F | Resp 12 | Ht 62.6 in | Wt 142.2 lb

## 2016-09-15 DIAGNOSIS — Z1231 Encounter for screening mammogram for malignant neoplasm of breast: Secondary | ICD-10-CM | POA: Diagnosis not present

## 2016-09-15 DIAGNOSIS — R87619 Unspecified abnormal cytological findings in specimens from cervix uteri: Secondary | ICD-10-CM | POA: Diagnosis not present

## 2016-09-15 DIAGNOSIS — R5383 Other fatigue: Secondary | ICD-10-CM | POA: Diagnosis not present

## 2016-09-15 DIAGNOSIS — N6019 Diffuse cystic mastopathy of unspecified breast: Secondary | ICD-10-CM

## 2016-09-15 DIAGNOSIS — E7800 Pure hypercholesterolemia, unspecified: Secondary | ICD-10-CM

## 2016-09-15 DIAGNOSIS — R030 Elevated blood-pressure reading, without diagnosis of hypertension: Secondary | ICD-10-CM | POA: Diagnosis not present

## 2016-09-15 DIAGNOSIS — Z0000 Encounter for general adult medical examination without abnormal findings: Secondary | ICD-10-CM | POA: Diagnosis not present

## 2016-09-15 DIAGNOSIS — Z13220 Encounter for screening for lipoid disorders: Secondary | ICD-10-CM

## 2016-09-15 LAB — COMPREHENSIVE METABOLIC PANEL
ALK PHOS: 51 U/L (ref 39–117)
ALT: 9 U/L (ref 0–35)
AST: 8 U/L (ref 0–37)
Albumin: 4.4 g/dL (ref 3.5–5.2)
BILIRUBIN TOTAL: 0.8 mg/dL (ref 0.2–1.2)
BUN: 15 mg/dL (ref 6–23)
CO2: 27 mEq/L (ref 19–32)
Calcium: 9.4 mg/dL (ref 8.4–10.5)
Chloride: 106 mEq/L (ref 96–112)
Creatinine, Ser: 0.83 mg/dL (ref 0.40–1.20)
GFR: 78.41 mL/min (ref >60.00)
GLUCOSE: 92 mg/dL (ref 70–99)
Potassium: 4.6 mEq/L (ref 3.5–5.1)
Sodium: 139 mEq/L (ref 135–145)
TOTAL PROTEIN: 6.6 g/dL (ref 6.0–8.3)

## 2016-09-15 LAB — CBC WITH DIFFERENTIAL/PLATELET
Basophils Absolute: 0.1 10*3/uL (ref 0.0–0.1)
Basophils Relative: 0.8 % (ref 0.0–3.0)
EOS PCT: 1.5 % (ref 0.0–5.0)
Eosinophils Absolute: 0.1 10*3/uL (ref 0.0–0.7)
HCT: 39.3 % (ref 36.0–46.0)
HEMOGLOBIN: 13.6 g/dL (ref 12.0–15.0)
Lymphocytes Relative: 19.2 % (ref 12.0–46.0)
Lymphs Abs: 1.3 10*3/uL (ref 0.7–4.0)
MCHC: 34.7 g/dL (ref 30.0–36.0)
MCV: 91.1 fl (ref 78.0–100.0)
MONO ABS: 0.5 10*3/uL (ref 0.1–1.0)
MONOS PCT: 6.8 % (ref 3.0–12.0)
Neutro Abs: 5 10*3/uL (ref 1.4–7.7)
Neutrophils Relative %: 71.7 % (ref 43.0–77.0)
Platelets: 298 10*3/uL (ref 150.0–400.0)
RBC: 4.31 Mil/uL (ref 3.87–5.11)
RDW: 13.1 % (ref 11.5–15.5)
WBC: 7 10*3/uL (ref 4.0–10.5)

## 2016-09-15 LAB — LIPID PANEL
Cholesterol: 176 mg/dL (ref 0–200)
HDL: 55.5 mg/dL (ref >39.00)
LDL Cholesterol: 110 mg/dL — ABNORMAL HIGH (ref 0–99)
NONHDL: 120.6
Total CHOL/HDL Ratio: 3
Triglycerides: 55 mg/dL (ref 0.0–149.0)
VLDL: 11 mg/dL (ref 0.0–40.0)

## 2016-09-15 LAB — VITAMIN D 25 HYDROXY (VIT D DEFICIENCY, FRACTURES): VITD: 43.5 ng/mL (ref 30.00–100.00)

## 2016-09-15 LAB — TSH: TSH: 1.81 u[IU]/mL (ref 0.35–4.50)

## 2016-09-15 NOTE — Progress Notes (Signed)
Patient ID: Suzanne Garrison, female   DOB: [birth year], 46 y.o.   MRN: [id number]   Subjective:    Patient ID: Suzanne Garrison, female    DOB: 05-30-1970, 46 y.o.   MRN: [id number]  HPI  Patient here for a scheduled physical exam. She sees gyn for her breast and pelvic/pap.  She just had her pap smear 08/16/16.  Mammogram is scheduled today.  She reports she is doing relatively well.  Tries to stay active.  No chest pain.  No sob.  No acid reflux.  No abdominal pain.  Bowels moving.  LMP 08/21/16.  Regular periods.  Saw opthalmology.  Has right cataract.  They are following.     Past Medical History:  Diagnosis Date  . Fibrocystic breast disease   . History of abnormal Pap smear   . Hypercholesterolemia    History reviewed. No pertinent surgical history. Family History  Problem Relation Age of Onset  . Thyroid disease Maternal Grandmother   . Alcohol abuse Paternal Grandfather   . Cancer Paternal Grandfather     lung  . Thyroid disease Maternal Grandfather   . Colon cancer Neg Hx   . Breast cancer Neg Hx    Social History   Social History  . Marital status: Divorced    Spouse name: N/A  . Number of children: 0  . Years of education: N/A   Occupational History  .  [employer]   Social History Main Topics  . Smoking status: Never Smoker  . Smokeless tobacco: Never Used  . Alcohol use 0.6 oz/week    1 Shots of liquor per week     Comment: occasional  . Drug use: No  . Sexual activity: Not Asked   Other Topics Concern  . None   Social History Narrative  . None    Outpatient Encounter Prescriptions as of 09/15/2016  Medication Sig  . phentermine 37.5 MG capsule Take 37.5 mg by mouth daily as needed.  . [DISCONTINUED] chlorpheniramine-HYDROcodone (TUSSIONEX PENNKINETIC ER) 10-8 MG/5ML SUER Take 5 mLs by mouth every 12 (twelve) hours as needed.  . [DISCONTINUED] predniSONE (DELTASONE) 50 MG tablet 1 tablet daily x 5 days.   No facility-administered encounter  medications on file as of 09/15/2016.     Review of Systems  Constitutional: Negative for appetite change and unexpected weight change.  HENT: Negative for congestion and sinus pressure.   Eyes: Negative for pain and visual disturbance.  Respiratory: Negative for cough, chest tightness and shortness of breath.   Cardiovascular: Negative for chest pain, palpitations and leg swelling.  Gastrointestinal: Negative for abdominal pain, diarrhea, nausea and vomiting.  Genitourinary: Negative for difficulty urinating and dysuria.  Musculoskeletal: Negative for back pain and joint swelling.  Skin: Negative for color change and rash.  Neurological: Positive for light-headedness. Negative for dizziness and headaches.  Hematological: Negative for adenopathy. Does not bruise/bleed easily.  Psychiatric/Behavioral: Negative for agitation and dysphoric mood.       Objective:     Blood pressure rechecked by me:  128/84  Physical Exam  Constitutional: She is oriented to person, place, and time. She appears well-developed and well-nourished. No distress.  HENT:  Nose: Nose normal.  Mouth/Throat: Oropharynx is clear and moist.  Eyes: Right eye exhibits no discharge. Left eye exhibits no discharge. No scleral icterus.  Neck: Neck supple. No thyromegaly present.  Cardiovascular: Normal rate and regular rhythm.   Pulmonary/Chest: Breath sounds normal. No accessory muscle usage. No tachypnea. No respiratory  distress. She has no decreased breath sounds. She has no wheezes. She has no rhonchi. Right breast exhibits no inverted nipple, no mass, no nipple discharge and no tenderness (no axillary adenopathy). Left breast exhibits no inverted nipple, no mass, no nipple discharge and no tenderness (no axilarry adenopathy).  Abdominal: Soft. Bowel sounds are normal. There is no tenderness.  Musculoskeletal: She exhibits no edema or tenderness.  Lymphadenopathy:    She has no cervical adenopathy.  Neurological:  She is alert and oriented to person, place, and time.  Skin: Skin is warm. No rash noted. No erythema.  Psychiatric: She has a normal mood and affect. Her behavior is normal.    BP 140/82 (BP Location: Left Arm, Patient Position: Sitting, Cuff Size: Normal)   Pulse 87   Temp 98.6 F (37 C) (Oral)   Resp 12   Ht 5' 2.6" (1.59 m)   Wt 142 lb 3.2 oz (64.5 kg)   LMP 08/21/2016   SpO2 98%   BMI 25.51 kg/m  Wt Readings from Last 3 Encounters:  09/15/16 142 lb 3.2 oz (64.5 kg)  10/11/15 144 lb (65.3 kg)  09/10/15 142 lb 6 oz (64.6 kg)     Lab Results  Component Value Date   WBC 7.0 09/15/2016   HGB 13.6 09/15/2016   HCT 39.3 09/15/2016   PLT 298.0 09/15/2016   GLUCOSE 92 09/15/2016   CHOL 176 09/15/2016   TRIG 55.0 09/15/2016   HDL 55.50 09/15/2016   LDLCALC 110 (H) 09/15/2016   ALT 9 09/15/2016   AST 8 09/15/2016   NA 139 09/15/2016   K 4.6 09/15/2016   CL 106 09/15/2016   CREATININE 0.83 09/15/2016   BUN 15 09/15/2016   CO2 27 09/15/2016   TSH 1.81 09/15/2016    Mm Digital Screening Bilateral  Result Date: 05/20/2015 CLINICAL DATA:  Screening. EXAM: DIGITAL SCREENING BILATERAL MAMMOGRAM WITH CAD COMPARISON:  Previous exam(s). ACR Breast Density Category b: There are scattered areas of fibroglandular density. FINDINGS: There are no findings suspicious for malignancy. Images were processed with CAD. IMPRESSION: No mammographic evidence of malignancy. A result letter of this screening mammogram will be mailed directly to the patient. RECOMMENDATION: Screening mammogram in one year. (Code:SM-B-01Y) BI-RADS CATEGORY  1: Negative. Electronically Signed   By: Harmon Pier M.D.   On: 05/20/2015 13:12       Assessment & Plan:   Problem List Items Addressed This Visit    Abnormal Pap smear of cervix    Followed by gyn.  Just checked.  Obtain records from Dr Toya Smothers.        Elevated blood pressure reading    Blood pressure elevated today.  Recheck improved.  Have her  spot check her pressure and send in readings.  Follow.  Check metabolic panel.        Fibrocystic breast disease    Mammogram scheduled today.        Health care maintenance    Physical today 09/15/16.  Saw gyn.  Had pap.  Obtain results.  Scheduled for mammogram today.        Hypercholesterolemia    Low cholesterol diet and exercise.  Follow lipid panel.         Other Visit Diagnoses    Routine general medical examination at a health care facility    -  Primary   Other fatigue       Relevant Orders   Comprehensive metabolic panel (Completed)   CBC with Differential/Platelet (Completed)  TSH (Completed)   VITAMIN D 25 Hydroxy (Vit-D Deficiency, Fractures) (Completed)   Screening cholesterol level       Relevant Orders   Lipid panel (Completed)       Dale Garland, MD

## 2016-09-15 NOTE — Patient Instructions (Signed)
Monitor your blood pressures and send in readings over the next month.

## 2016-09-15 NOTE — Progress Notes (Signed)
Pre-visit discussion using our clinic review tool. No additional management support is needed unless otherwise documented below in the visit note.  

## 2016-09-17 ENCOUNTER — Encounter: Payer: Self-pay | Admitting: Internal Medicine

## 2016-09-18 ENCOUNTER — Telehealth: Payer: Self-pay

## 2016-09-18 NOTE — Telephone Encounter (Signed)
Received call from [place] at office they will be sending report over

## 2016-09-18 NOTE — Telephone Encounter (Signed)
Called Suzanne Garrison we need to get copy of pap that was done last month. l/m on v/m. Office number is [phone]

## 2016-09-24 DIAGNOSIS — I10 Essential (primary) hypertension: Secondary | ICD-10-CM | POA: Insufficient documentation

## 2016-09-24 NOTE — Assessment & Plan Note (Signed)
Followed by gyn.  Just checked.  Obtain records from Dr Toya SmothersKnowles-Jonas.

## 2016-09-24 NOTE — Assessment & Plan Note (Signed)
Mammogram scheduled today.  

## 2016-09-24 NOTE — Assessment & Plan Note (Signed)
Physical today 09/15/16.  Saw gyn.  Had pap.  Obtain results.  Scheduled for mammogram today.

## 2016-09-24 NOTE — Assessment & Plan Note (Signed)
Blood pressure elevated today.  Recheck improved.  Have her spot check her pressure and send in readings.  Follow.  Check metabolic panel.

## 2016-09-24 NOTE — Assessment & Plan Note (Signed)
Low cholesterol diet and exercise.  Follow lipid panel.   

## 2017-06-29 ENCOUNTER — Other Ambulatory Visit: Payer: Self-pay | Admitting: Internal Medicine

## 2017-06-29 DIAGNOSIS — Z1231 Encounter for screening mammogram for malignant neoplasm of breast: Secondary | ICD-10-CM

## 2017-09-19 HISTORY — PX: CATARACT EXTRACTION: SUR2

## 2017-09-21 ENCOUNTER — Ambulatory Visit (INDEPENDENT_AMBULATORY_CARE_PROVIDER_SITE_OTHER): Payer: BLUE CROSS/BLUE SHIELD | Admitting: Internal Medicine

## 2017-09-21 ENCOUNTER — Encounter: Payer: Self-pay | Admitting: Internal Medicine

## 2017-09-21 VITALS — BP 138/90 | HR 70 | Temp 98.4°F | Resp 18 | Ht 63.0 in | Wt 151.0 lb

## 2017-09-21 DIAGNOSIS — R030 Elevated blood-pressure reading, without diagnosis of hypertension: Secondary | ICD-10-CM

## 2017-09-21 DIAGNOSIS — E7800 Pure hypercholesterolemia, unspecified: Secondary | ICD-10-CM | POA: Diagnosis not present

## 2017-09-21 DIAGNOSIS — Z13220 Encounter for screening for lipoid disorders: Secondary | ICD-10-CM | POA: Diagnosis not present

## 2017-09-21 DIAGNOSIS — Z0000 Encounter for general adult medical examination without abnormal findings: Secondary | ICD-10-CM

## 2017-09-21 DIAGNOSIS — R5383 Other fatigue: Secondary | ICD-10-CM

## 2017-09-21 DIAGNOSIS — R87619 Unspecified abnormal cytological findings in specimens from cervix uteri: Secondary | ICD-10-CM | POA: Diagnosis not present

## 2017-09-21 LAB — CBC WITH DIFFERENTIAL/PLATELET
BASOS ABS: 0 10*3/uL (ref 0.0–0.1)
Basophils Relative: 0.5 % (ref 0.0–3.0)
Eosinophils Absolute: 0.1 10*3/uL (ref 0.0–0.7)
Eosinophils Relative: 1.1 % (ref 0.0–5.0)
HEMATOCRIT: 39.6 % (ref 36.0–46.0)
HEMOGLOBIN: 13.7 g/dL (ref 12.0–15.0)
LYMPHS PCT: 16.2 % (ref 12.0–46.0)
Lymphs Abs: 1.2 10*3/uL (ref 0.7–4.0)
MCHC: 34.5 g/dL (ref 30.0–36.0)
MCV: 91.6 fl (ref 78.0–100.0)
MONOS PCT: 5.6 % (ref 3.0–12.0)
Monocytes Absolute: 0.4 10*3/uL (ref 0.1–1.0)
NEUTROS ABS: 5.5 10*3/uL (ref 1.4–7.7)
Neutrophils Relative %: 76.6 % (ref 43.0–77.0)
Platelets: 282 10*3/uL (ref 150.0–400.0)
RBC: 4.32 Mil/uL (ref 3.87–5.11)
RDW: 12.9 % (ref 11.5–15.5)
WBC: 7.2 10*3/uL (ref 4.0–10.5)

## 2017-09-21 LAB — COMPREHENSIVE METABOLIC PANEL WITH GFR
ALT: 22 U/L (ref 0–35)
AST: 15 U/L (ref 0–37)
Albumin: 4.4 g/dL (ref 3.5–5.2)
Alkaline Phosphatase: 54 U/L (ref 39–117)
BUN: 18 mg/dL (ref 6–23)
CO2: 27 meq/L (ref 19–32)
Calcium: 9.2 mg/dL (ref 8.4–10.5)
Chloride: 104 meq/L (ref 96–112)
Creatinine, Ser: 0.7 mg/dL (ref 0.40–1.20)
GFR: 95.02 mL/min
Glucose, Bld: 89 mg/dL (ref 70–99)
Potassium: 4.4 meq/L (ref 3.5–5.1)
Sodium: 139 meq/L (ref 135–145)
Total Bilirubin: 0.7 mg/dL (ref 0.2–1.2)
Total Protein: 6.6 g/dL (ref 6.0–8.3)

## 2017-09-21 LAB — LIPID PANEL
Cholesterol: 204 mg/dL — ABNORMAL HIGH (ref 0–200)
HDL: 74.7 mg/dL
LDL Cholesterol: 118 mg/dL — ABNORMAL HIGH (ref 0–99)
NonHDL: 129.79
Total CHOL/HDL Ratio: 3
Triglycerides: 60 mg/dL (ref 0.0–149.0)
VLDL: 12 mg/dL (ref 0.0–40.0)

## 2017-09-21 LAB — VITAMIN D 25 HYDROXY (VIT D DEFICIENCY, FRACTURES): VITD: 45.97 ng/mL (ref 30.00–100.00)

## 2017-09-21 LAB — TSH: TSH: 2.53 u[IU]/mL (ref 0.35–4.50)

## 2017-09-21 NOTE — Progress Notes (Signed)
Patient ID: Suzanne Garrison, female   DOB: December 22, 1970, 47 y.o.   MRN: [id number]   Subjective:    Patient ID: Suzanne Garrison, female    DOB: [birth year], 47 y.o.   MRN: [id number]  HPI  Patient here for a scheduled physical.  Saw gyn earlier this week.  Had breast exam and pelvic/pap smear.  She reports increased stress with her eyes.  Has cataracts.  Planning for surgery at the end of this month.  Has been stressful, not being able to see as well and getting in for evaluation.  Feels that this could be contributing to her elevated blood pressure.  Also, she has not been exercising as well.  Has started over the last two weeks, getting in a more regular exercise routine.  No chest pain.  No sob. No acid reflux.  No abdominal pain.  Bowels moving.  Periods regular.  Blood pressures on outside checks mostly averaging 130s/80's.  Has IUD.  Has been trying to watch her diet recently.  Has cut out soft drinks.     Past Medical History:  Diagnosis Date  . Fibrocystic breast disease   . History of abnormal Pap smear   . Hypercholesterolemia    History reviewed. No pertinent surgical history. Family History  Problem Relation Age of Onset  . Thyroid disease Maternal Grandmother   . Alcohol abuse Paternal Grandfather   . Cancer Paternal Grandfather        lung  . Thyroid disease Maternal Grandfather   . Colon cancer Neg Hx   . Breast cancer Neg Hx    Social History   Socioeconomic History  . Marital status: Divorced    Spouse name: Not on file  . Number of children: 0  . Years of education: Not on file  . Highest education level: Not on file  Occupational History    Employer: [employer]  Social Needs  . Financial resource strain: Not on file  . Food insecurity:    Worry: Not on file    Inability: Not on file  . Transportation needs:    Medical: Not on file    Non-medical: Not on file  Tobacco Use  . Smoking status: Never Smoker  . Smokeless tobacco: Never Used    Substance and Sexual Activity  . Alcohol use: Yes    Alcohol/week: 0.6 oz    Types: 1 Shots of liquor per week    Comment: occasional  . Drug use: No  . Sexual activity: Not on file  Lifestyle  . Physical activity:    Days per week: Not on file    Minutes per session: Not on file  . Stress: Not on file  Relationships  . Social connections:    Talks on phone: Not on file    Gets together: Not on file    Attends religious service: Not on file    Active member of club or organization: Not on file    Attends meetings of clubs or organizations: Not on file    Relationship status: Not on file  Other Topics Concern  . Not on file  Social History Narrative  . Not on file    Outpatient Encounter Medications as of 09/21/2017  Medication Sig  . [DISCONTINUED] phentermine 37.5 MG capsule Take 37.5 mg by mouth daily as needed.   No facility-administered encounter medications on file as of 09/21/2017.     Review of Systems     Objective:    Physical  Exam  Constitutional: She appears well-developed and well-nourished. No distress.  HENT:  Nose: Nose normal.  Mouth/Throat: Oropharynx is clear and moist.  Neck: Neck supple. No thyromegaly present.  Cardiovascular: Normal rate and regular rhythm.  Pulmonary/Chest: Breath sounds normal. No respiratory distress. She has no wheezes.  No breast exam performed.  Performed by gyn.    Abdominal: Soft. Bowel sounds are normal. There is no tenderness.  Genitourinary:  Genitourinary Comments: Performed by gyn.    Musculoskeletal: She exhibits no edema or tenderness.  Lymphadenopathy:    She has no cervical adenopathy.  Psychiatric: She has a normal mood and affect. Her behavior is normal.    BP 138/90 (BP Location: Left Arm, Patient Position: Sitting, Cuff Size: Normal)   Pulse 70   Temp 98.4 F (36.9 C) (Oral)   Resp 18   Ht  (1.6 m)   Wt 151 lb (68.5 kg)   SpO2 97%   BMI 26.75 kg/m  Wt Readings from Last 3 Encounters:   09/21/17 151 lb (68.5 kg)  09/15/16 142 lb 3.2 oz (64.5 kg)  10/11/15 144 lb (65.3 kg)     Lab Results  Component Value Date   WBC 7.2 09/21/2017   HGB 13.7 09/21/2017   HCT 39.6 09/21/2017   PLT 282.0 09/21/2017   GLUCOSE 89 09/21/2017   CHOL 204 (H) 09/21/2017   TRIG 60.0 09/21/2017   HDL 74.70 09/21/2017   LDLCALC 118 (H) 09/21/2017   ALT 22 09/21/2017   AST 15 09/21/2017   NA 139 09/21/2017   K 4.4 09/21/2017   CL 104 09/21/2017   CREATININE 0.70 09/21/2017   BUN 18 09/21/2017   CO2 27 09/21/2017   TSH 2.53 09/21/2017    Mm Screening Breast Tomo Bilateral  Result Date: 09/15/2016 CLINICAL DATA:  Screening. EXAM: 2D DIGITAL SCREENING BILATERAL MAMMOGRAM WITH CAD AND ADJUNCT TOMO COMPARISON:  Previous exam(s). ACR Breast Density Category c: The breast tissue is heterogeneously dense, which may obscure small masses. FINDINGS: There are no findings suspicious for malignancy. Images were processed with CAD. IMPRESSION: No mammographic evidence of malignancy. A result letter of this screening mammogram will be mailed directly to the patient. RECOMMENDATION: Screening mammogram in one year. (Code:SM-B-01Y) BI-RADS CATEGORY  1: Negative. Electronically Signed   By: Annia Belt M.D.   On: 09/15/2016 10:02       Assessment & Plan:   Problem List Items Addressed This Visit    Abnormal Pap smear of cervix    Followed by gyn (Dr Skeet SimmerLorre Munroe).  Just evaluated two days ago.  Follow.  Obtain records.        Elevated blood pressure reading    Discussed with her today.  Discussed spot checking her pressure.  Get her back in to reassess.        Health care maintenance    Scheduled for physical today 09/21/17.  Saw gyn 09/19/17.  Had pap and breast exam.  Mammogram scheduled in one week.        Hypercholesterolemia    Low cholesterol diet and exercise.  Follow lipid panel.         Other Visit Diagnoses    Routine general medical examination at a health care facility    -   Primary   Other fatigue       Relevant Orders   CBC with Differential/Platelet (Completed)   Comprehensive metabolic panel (Completed)   TSH (Completed)   VITAMIN D 25 Hydroxy (Vit-D Deficiency, Fractures) (Completed)  Screening cholesterol level       Relevant Orders   Lipid panel (Completed)       Dale Mission, MD

## 2017-09-24 ENCOUNTER — Encounter: Payer: Self-pay | Admitting: Internal Medicine

## 2017-09-24 NOTE — Assessment & Plan Note (Signed)
Followed by gyn (Dr Skeet SimmerLorre Munroe).  Just evaluated two days ago.  Follow.  Obtain records.

## 2017-09-24 NOTE — Assessment & Plan Note (Signed)
Scheduled for physical today 09/21/17.  Saw gyn 09/19/17.  Had pap and breast exam.  Mammogram scheduled in one week.

## 2017-09-24 NOTE — Assessment & Plan Note (Signed)
Low cholesterol diet and exercise.  Follow lipid panel.   

## 2017-09-24 NOTE — Assessment & Plan Note (Signed)
Discussed with her today.  Discussed spot checking her pressure.  Get her back in to reassess.

## 2017-11-02 ENCOUNTER — Ambulatory Visit
Admission: RE | Admit: 2017-11-02 | Discharge: 2017-11-02 | Disposition: A | Discharge status: Home / Self Care | Payer: BLUE CROSS/BLUE SHIELD | Source: Ambulatory Visit | Attending: Internal Medicine | Admitting: Internal Medicine

## 2017-11-02 DIAGNOSIS — Z1231 Encounter for screening mammogram for malignant neoplasm of breast: Secondary | ICD-10-CM | POA: Insufficient documentation

## 2017-12-21 ENCOUNTER — Ambulatory Visit: Payer: BLUE CROSS/BLUE SHIELD | Admitting: Internal Medicine

## 2017-12-21 ENCOUNTER — Encounter: Payer: Self-pay | Admitting: Internal Medicine

## 2017-12-21 DIAGNOSIS — Z975 Presence of (intrauterine) contraceptive device: Secondary | ICD-10-CM

## 2017-12-21 DIAGNOSIS — R030 Elevated blood-pressure reading, without diagnosis of hypertension: Secondary | ICD-10-CM | POA: Diagnosis not present

## 2017-12-21 DIAGNOSIS — E7800 Pure hypercholesterolemia, unspecified: Secondary | ICD-10-CM

## 2017-12-21 LAB — POCT URINE PREGNANCY: Preg Test, Ur: NEGATIVE

## 2017-12-21 MED ORDER — LISINOPRIL 10 MG PO TABS: 10.0000 mg | ORAL_TABLET | Freq: Every day | ORAL | 1 refills | Status: DC

## 2017-12-21 NOTE — Progress Notes (Signed)
Patient ID: Suzanne Garrison, female   DOB: 07-13-1970, 47 y.o.   MRN: [id number]   Subjective:    Patient ID: Suzanne Garrison, female    DOB: February 06, 1971, 32 y.o.   MRN: [id number]  HPI  Patient here for a scheduled follow up.  She reports she is doing well.  Feels good.  Stays active.  No chest pain.  No sob.  No acid reflux.  No abdominal pain.  Bowels moving.  Has IUD in place.  Denies possibility of being pregnant.  Desires not to get pregnant.  Not sure when had IUD placed.  Has copper IUD.  Placed at [facility].  Blood pressures ranging 120-140s/70-80s.     Past Medical History:  Diagnosis Date  . Fibrocystic breast disease   . History of abnormal Pap smear   . Hypercholesterolemia    History reviewed. No pertinent surgical history. Family History  Problem Relation Age of Onset  . Thyroid disease Maternal Grandmother   . Alcohol abuse Paternal Grandfather   . Cancer Paternal Grandfather        lung  . Thyroid disease Maternal Grandfather   . Colon cancer Neg Hx   . Breast cancer Neg Hx    Social History   Socioeconomic History  . Marital status: Divorced    Spouse name: Not on file  . Number of children: 0  . Years of education: Not on file  . Highest education level: Not on file  Occupational History    Employer: [employer]  Social Needs  . Financial resource strain: Not on file  . Food insecurity:    Worry: Not on file    Inability: Not on file  . Transportation needs:    Medical: Not on file    Non-medical: Not on file  Tobacco Use  . Smoking status: Never Smoker  . Smokeless tobacco: Never Used  Substance and Sexual Activity  . Alcohol use: Yes    Alcohol/week: 0.6 oz    Types: 1 Shots of liquor per week    Comment: occasional  . Drug use: No  . Sexual activity: Not on file  Lifestyle  . Physical activity:    Days per week: Not on file    Minutes per session: Not on file  . Stress: Not on file  Relationships  . Social connections:    Talks  on phone: Not on file    Gets together: Not on file    Attends religious service: Not on file    Active member of club or organization: Not on file    Attends meetings of clubs or organizations: Not on file    Relationship status: Not on file  Other Topics Concern  . Not on file  Social History Narrative  . Not on file    Outpatient Encounter Medications as of 12/21/2017  Medication Sig  . lisinopril (PRINIVIL,ZESTRIL) 10 MG tablet Take 1 tablet (10 mg total) by mouth daily.   No facility-administered encounter medications on file as of 12/21/2017.     Review of Systems  Constitutional: Negative for appetite change and unexpected weight change.  HENT: Negative for congestion and sinus pressure.   Respiratory: Negative for cough, chest tightness and shortness of breath.   Cardiovascular: Negative for chest pain, palpitations and leg swelling.  Gastrointestinal: Negative for abdominal pain, diarrhea, nausea and vomiting.  Genitourinary: Negative for difficulty urinating and dysuria.  Musculoskeletal: Negative for joint swelling and myalgias.  Skin: Negative for color change  and rash.  Neurological: Negative for dizziness, light-headedness and headaches.  Psychiatric/Behavioral: Negative for agitation and dysphoric mood.       Objective:    Physical Exam  Constitutional: She appears well-developed and well-nourished. No distress.  HENT:  Nose: Nose normal.  Mouth/Throat: Oropharynx is clear and moist.  Neck: Neck supple. No thyromegaly present.  Cardiovascular: Normal rate and regular rhythm.  Pulmonary/Chest: Breath sounds normal. No respiratory distress. She has no wheezes.  Abdominal: Soft. Bowel sounds are normal. There is no tenderness.  Musculoskeletal: She exhibits no edema or tenderness.  Lymphadenopathy:    She has no cervical adenopathy.  Skin: No rash noted. No erythema.  Psychiatric: She has a normal mood and affect. Her behavior is normal.    BP (!) 162/100    Pulse 83   Temp 98.4 F (36.9 C) (Oral)   Resp 18   Ht 5\' 3"  (1.6 m)   Wt 152 lb 6 oz (69.1 kg)   SpO2 99%   BMI 26.99 kg/m  Wt Readings from Last 3 Encounters:  12/21/17 152 lb 6 oz (69.1 kg)  09/21/17 151 lb (68.5 kg)  09/15/16 142 lb 3.2 oz (64.5 kg)     Lab Results  Component Value Date   WBC 7.2 09/21/2017   HGB 13.7 09/21/2017   HCT 39.6 09/21/2017   PLT 282.0 09/21/2017   GLUCOSE 89 09/21/2017   CHOL 204 (H) 09/21/2017   TRIG 60.0 09/21/2017   HDL 74.70 09/21/2017   LDLCALC 118 (H) 09/21/2017   ALT 22 09/21/2017   AST 15 09/21/2017   NA 139 09/21/2017   K 4.4 09/21/2017   CL 104 09/21/2017   CREATININE 0.70 09/21/2017   BUN 18 09/21/2017   CO2 27 09/21/2017   TSH 2.53 09/21/2017    Mm Screening Breast Tomo Bilateral  Result Date: 11/02/2017 CLINICAL DATA:  Screening. EXAM: DIGITAL SCREENING BILATERAL MAMMOGRAM WITH TOMO AND CAD COMPARISON:  Previous exam(s). ACR Breast Density Category c: The breast tissue is heterogeneously dense, which may obscure small masses. FINDINGS: There are no findings suspicious for malignancy. Images were processed with CAD. IMPRESSION: No mammographic evidence of malignancy. A result letter of this screening mammogram will be mailed directly to the patient. RECOMMENDATION: Screening mammogram in one year. (Code:SM-B-01Y) BI-RADS CATEGORY  1: Negative. Electronically Signed   By: Ted Mcalpineobrinka  Dimitrova M.D.   On: 11/02/2017 12:29       Assessment & Plan:   Problem List Items Addressed This Visit    Elevated blood pressure reading    Blood pressure remains elevated.  Recheck 158/96.  Will start her on lisinopril 10mg  q day.  Check metabolic panel 2 weeks after starting.  Follow pressures.  Follow metabolic panel.  Has IUD in place.  [facility] Not sure of date.  Will obtain records.  Will start ace inhibitor.  Desires no pregnancy.        Relevant Orders   Basic metabolic panel   Hypercholesterolemia    Low cholesterol diet  and exercise.  Follow lipid panel.        Relevant Medications   lisinopril (PRINIVIL,ZESTRIL) 10 MG tablet    Other Visit Diagnoses    IUD (intrauterine device) in place       Relevant Orders   POCT urine pregnancy (Completed)       Dale DurhamSCOTT, Raymone Pembroke, MD

## 2017-12-22 ENCOUNTER — Encounter: Payer: Self-pay | Admitting: Internal Medicine

## 2017-12-23 ENCOUNTER — Encounter: Payer: Self-pay | Admitting: Internal Medicine

## 2017-12-23 NOTE — Assessment & Plan Note (Signed)
Low cholesterol diet and exercise.  Follow lipid panel.   

## 2017-12-23 NOTE — Assessment & Plan Note (Addendum)
Blood pressure remains elevated.  Recheck 158/96.  Will start her on lisinopril 10mg  q day.  Check metabolic panel 2 weeks after starting.  Follow pressures.  Follow metabolic panel.  Has IUD in place.  [facility]. Not sure of date.  Will obtain records.  Will start ace inhibitor.  Desires no pregnancy.

## 2018-01-02 ENCOUNTER — Other Ambulatory Visit: Payer: BLUE CROSS/BLUE SHIELD

## 2018-01-11 ENCOUNTER — Other Ambulatory Visit (INDEPENDENT_AMBULATORY_CARE_PROVIDER_SITE_OTHER): Payer: BLUE CROSS/BLUE SHIELD

## 2018-01-11 DIAGNOSIS — R030 Elevated blood-pressure reading, without diagnosis of hypertension: Secondary | ICD-10-CM | POA: Diagnosis not present

## 2018-01-11 LAB — BASIC METABOLIC PANEL
BUN: 18 mg/dL (ref 6–23)
CO2: 25 mEq/L (ref 19–32)
Calcium: 9.4 mg/dL (ref 8.4–10.5)
Chloride: 105 mEq/L (ref 96–112)
Creatinine, Ser: 0.81 mg/dL (ref 0.40–1.20)
GFR: 80.19 mL/min (ref >60.00)
Glucose, Bld: 83 mg/dL (ref 70–99)
POTASSIUM: 4.2 meq/L (ref 3.5–5.1)
SODIUM: 137 meq/L (ref 135–145)

## 2018-01-14 ENCOUNTER — Encounter: Payer: Self-pay | Admitting: Internal Medicine

## 2018-02-15 ENCOUNTER — Encounter: Payer: Self-pay | Admitting: Internal Medicine

## 2018-02-15 ENCOUNTER — Ambulatory Visit: Payer: BLUE CROSS/BLUE SHIELD | Admitting: Internal Medicine

## 2018-02-15 DIAGNOSIS — I10 Essential (primary) hypertension: Secondary | ICD-10-CM | POA: Diagnosis not present

## 2018-02-15 DIAGNOSIS — E7800 Pure hypercholesterolemia, unspecified: Secondary | ICD-10-CM

## 2018-02-15 NOTE — Progress Notes (Signed)
Patient ID: Suzanne Garrison, female   DOB: 10/21/1970, 47 y.o.   MRN: [id number]   Subjective:    Patient ID: Suzanne Garrison, female    DOB: [birth year], 47 y.o.   MRN: [id number]  HPI  Patient here for a scheduled follow up.  She reports she is doing relatively well.  Started on lisinopril last visit.  Blood pressure doing better.  Overall feels good.  Has had some congestion and cough, but feels is better.  Discussed lisinopril possible causing cough.  Reports cough is better.  No chest pain.  No sob.  No acid reflux.  No abdominal pain.  Bowels moving.  No urine change.  Has IUD in. Place.  Per [facility] notes - placed 08/2012.     Past Medical History:  Diagnosis Date  . Fibrocystic breast disease   . History of abnormal Pap smear   . Hypercholesterolemia    History reviewed. No pertinent surgical history. Family History  Problem Relation Age of Onset  . Thyroid disease Maternal Grandmother   . Alcohol abuse Paternal Grandfather   . Cancer Paternal Grandfather        lung  . Thyroid disease Maternal Grandfather   . Colon cancer Neg Hx   . Breast cancer Neg Hx    Social History   Socioeconomic History  . Marital status: Divorced    Spouse name: Not on file  . Number of children: 0  . Years of education: Not on file  . Highest education level: Not on file  Occupational History    Employer: [employer]  Social Needs  . Financial resource strain: Not on file  . Food insecurity:    Worry: Not on file    Inability: Not on file  . Transportation needs:    Medical: Not on file    Non-medical: Not on file  Tobacco Use  . Smoking status: Never Smoker  . Smokeless tobacco: Never Used  Substance and Sexual Activity  . Alcohol use: Yes    Alcohol/week: 1.0 standard drinks    Types: 1 Shots of liquor per week    Comment: occasional  . Drug use: No  . Sexual activity: Not on file  Lifestyle  . Physical activity:    Days per week: Not on file    Minutes per  session: Not on file  . Stress: Not on file  Relationships  . Social connections:    Talks on phone: Not on file    Gets together: Not on file    Attends religious service: Not on file    Active member of club or organization: Not on file    Attends meetings of clubs or organizations: Not on file    Relationship status: Not on file  Other Topics Concern  . Not on file  Social History Narrative  . Not on file    Outpatient Encounter Medications as of 02/15/2018  Medication Sig  . lisinopril (PRINIVIL,ZESTRIL) 10 MG tablet Take 1 tablet (10 mg total) by mouth daily.   No facility-administered encounter medications on file as of 02/15/2018.     Review of Systems  Constitutional: Negative for appetite change and unexpected weight change.  HENT: Negative for congestion and sinus pressure.   Respiratory: Negative for cough, chest tightness and shortness of breath.   Cardiovascular: Negative for chest pain, palpitations and leg swelling.  Gastrointestinal: Negative for abdominal pain, diarrhea, nausea and vomiting.  Genitourinary: Negative for difficulty urinating and dysuria.  Musculoskeletal:  Negative for joint swelling and myalgias.  Skin: Negative for color change and rash.  Neurological: Negative for dizziness, light-headedness and headaches.  Psychiatric/Behavioral: Negative for agitation and dysphoric mood.       Objective:    Physical Exam  Constitutional: She appears well-developed and well-nourished. No distress.  HENT:  Nose: Nose normal.  Mouth/Throat: Oropharynx is clear and moist.  Neck: Neck supple. No thyromegaly present.  Cardiovascular: Normal rate and regular rhythm.  Pulmonary/Chest: Breath sounds normal. No respiratory distress. She has no wheezes.  Abdominal: Soft. Bowel sounds are normal. There is no tenderness.  Musculoskeletal: She exhibits no edema or tenderness.  Lymphadenopathy:    She has no cervical adenopathy.  Skin: No rash noted. No erythema.   Psychiatric: She has a normal mood and affect. Her behavior is normal.    BP (!) 152/90 (BP Location: Left Arm, Patient Position: Sitting, Cuff Size: Normal)   Pulse 75   Temp 98.2 F (36.8 C) (Oral)   Resp 18   Wt 150 lb (68 kg)   SpO2 98%   BMI 26.57 kg/m  Wt Readings from Last 3 Encounters:  02/15/18 150 lb (68 kg)  12/21/17 152 lb 6 oz (69.1 kg)  09/21/17 151 lb (68.5 kg)     Lab Results  Component Value Date   WBC 7.2 09/21/2017   HGB 13.7 09/21/2017   HCT 39.6 09/21/2017   PLT 282.0 09/21/2017   GLUCOSE 83 01/11/2018   CHOL 204 (H) 09/21/2017   TRIG 60.0 09/21/2017   HDL 74.70 09/21/2017   LDLCALC 118 (H) 09/21/2017   ALT 22 09/21/2017   AST 15 09/21/2017   NA 137 01/11/2018   K 4.2 01/11/2018   CL 105 01/11/2018   CREATININE 0.81 01/11/2018   BUN 18 01/11/2018   CO2 25 01/11/2018   TSH 2.53 09/21/2017    Mm Screening Breast Tomo Bilateral  Result Date: 11/02/2017 CLINICAL DATA:  Screening. EXAM: DIGITAL SCREENING BILATERAL MAMMOGRAM WITH TOMO AND CAD COMPARISON:  Previous exam(s). ACR Breast Density Category c: The breast tissue is heterogeneously dense, which may obscure small masses. FINDINGS: There are no findings suspicious for malignancy. Images were processed with CAD. IMPRESSION: No mammographic evidence of malignancy. A result letter of this screening mammogram will be mailed directly to the patient. RECOMMENDATION: Screening mammogram in one year. (Code:SM-B-01Y) BI-RADS CATEGORY  1: Negative. Electronically Signed   By: Ted Mcalpine M.D.   On: 11/02/2017 12:29       Assessment & Plan:   Problem List Items Addressed This Visit    Essential hypertension    Blood pressure on recheck improved.  Checks outside of office doing well.  Continue lisinopril at current dose.  Follow pressures.  Follow metabolic panel.        Hypercholesterolemia    Low cholesterol diet and exercise.  Follow lipid panel.            Dale Belton, MD

## 2018-02-24 ENCOUNTER — Encounter: Payer: Self-pay | Admitting: Internal Medicine

## 2018-02-24 NOTE — Assessment & Plan Note (Signed)
Low cholesterol diet and exercise.  Follow lipid panel.   

## 2018-02-24 NOTE — Assessment & Plan Note (Signed)
Blood pressure on recheck improved.  Checks outside of office doing well.  Continue lisinopril at current dose.  Follow pressures.  Follow metabolic panel.

## 2018-03-06 ENCOUNTER — Other Ambulatory Visit: Payer: Self-pay | Admitting: Internal Medicine

## 2018-04-26 ENCOUNTER — Encounter: Payer: Self-pay | Admitting: Internal Medicine

## 2018-04-26 ENCOUNTER — Ambulatory Visit: Payer: BLUE CROSS/BLUE SHIELD | Admitting: Internal Medicine

## 2018-04-26 DIAGNOSIS — I10 Essential (primary) hypertension: Secondary | ICD-10-CM

## 2018-04-26 DIAGNOSIS — E7800 Pure hypercholesterolemia, unspecified: Secondary | ICD-10-CM

## 2018-04-26 DIAGNOSIS — R1013 Epigastric pain: Secondary | ICD-10-CM | POA: Diagnosis not present

## 2018-04-26 LAB — HEPATIC FUNCTION PANEL
ALBUMIN: 4.2 g/dL (ref 3.5–5.2)
ALK PHOS: 55 U/L (ref 39–117)
ALT: 7 U/L (ref 0–35)
AST: 9 U/L (ref 0–37)
BILIRUBIN DIRECT: 0.1 mg/dL (ref 0.0–0.3)
TOTAL PROTEIN: 7 g/dL (ref 6.0–8.3)
Total Bilirubin: 0.7 mg/dL (ref 0.2–1.2)

## 2018-04-26 LAB — BASIC METABOLIC PANEL
BUN: 14 mg/dL (ref 6–23)
CALCIUM: 8.9 mg/dL (ref 8.4–10.5)
CO2: 24 mEq/L (ref 19–32)
CREATININE: 0.69 mg/dL (ref 0.40–1.20)
Chloride: 105 mEq/L (ref 96–112)
GFR: 96.37 mL/min (ref >60.00)
Glucose, Bld: 92 mg/dL (ref 70–99)
Potassium: 4.6 mEq/L (ref 3.5–5.1)
Sodium: 138 mEq/L (ref 135–145)

## 2018-04-26 LAB — CBC WITH DIFFERENTIAL/PLATELET
BASOS ABS: 0.1 10*3/uL (ref 0.0–0.1)
BASOS PCT: 0.4 % (ref 0.0–3.0)
EOS ABS: 0.4 10*3/uL (ref 0.0–0.7)
Eosinophils Relative: 3 % (ref 0.0–5.0)
HEMATOCRIT: 36.2 % (ref 36.0–46.0)
HEMOGLOBIN: 12.4 g/dL (ref 12.0–15.0)
LYMPHS PCT: 12.9 % (ref 12.0–46.0)
Lymphs Abs: 1.7 10*3/uL (ref 0.7–4.0)
MCHC: 34.2 g/dL (ref 30.0–36.0)
MCV: 92.7 fl (ref 78.0–100.0)
MONOS PCT: 5.6 % (ref 3.0–12.0)
Monocytes Absolute: 0.7 10*3/uL (ref 0.1–1.0)
NEUTROS ABS: 10.2 10*3/uL — AB (ref 1.4–7.7)
Neutrophils Relative %: 78.1 % — ABNORMAL HIGH (ref 43.0–77.0)
PLATELETS: 269 10*3/uL (ref 150.0–400.0)
RBC: 3.91 Mil/uL (ref 3.87–5.11)
RDW: 12.9 % (ref 11.5–15.5)
WBC: 13.1 10*3/uL — AB (ref 4.0–10.5)

## 2018-04-26 LAB — AMYLASE: AMYLASE: 26 U/L — AB (ref 27–131)

## 2018-04-26 LAB — LIPASE: LIPASE: 10 U/L — AB (ref 11.0–59.0)

## 2018-04-26 MED ORDER — LOSARTAN POTASSIUM 25 MG PO TABS: 25.0000 mg | ORAL_TABLET | Freq: Every day | ORAL | 2 refills | Status: DC

## 2018-04-26 NOTE — Progress Notes (Signed)
Patient ID: Suzanne Garrison, female   DOB: Feb 24, 1971, 47 y.o.   MRN: [id number]   Subjective:    Patient ID: Suzanne Garrison, female    DOB: [birth year], 47 y.o.   MRN: [id number]  HPI  Patient here for a scheduled follow up.  She reports she has been doing well.  Blood pressure has been doing well.  Still with some cough.  Describes mostly as a dry cough.  Boyfriend has been sick.  Was not sure if she had a cold or allergies.  Had some cough last visit as well.  Discussed that lisinopril could be the possible cause.  Discussed changing blood pressure medication.  No chest pain.  No sob.  She does report noticing some stomach discomfort - recently.  Just started over the last 1-2 days.  Better today.  No nausea or vomiting.  Bowels moving.  No urine change.  Eating.     Past Medical History:  Diagnosis Date  . Fibrocystic breast disease   . History of abnormal Pap smear   . Hypercholesterolemia    History reviewed. No pertinent surgical history. Family History  Problem Relation Age of Onset  . Thyroid disease Maternal Grandmother   . Alcohol abuse Paternal Grandfather   . Cancer Paternal Grandfather        lung  . Thyroid disease Maternal Grandfather   . Colon cancer Neg Hx   . Breast cancer Neg Hx    Social History   Socioeconomic History  . Marital status: Divorced    Spouse name: Not on file  . Number of children: 0  . Years of education: Not on file  . Highest education level: Not on file  Occupational History    Employer: [employer]  Social Needs  . Financial resource strain: Not on file  . Food insecurity:    Worry: Not on file    Inability: Not on file  . Transportation needs:    Medical: Not on file    Non-medical: Not on file  Tobacco Use  . Smoking status: Never Smoker  . Smokeless tobacco: Never Used  Substance and Sexual Activity  . Alcohol use: Yes    Alcohol/week: 1.0 standard drinks    Types: 1 Shots of liquor per week    Comment: occasional   . Drug use: No  . Sexual activity: Not on file  Lifestyle  . Physical activity:    Days per week: Not on file    Minutes per session: Not on file  . Stress: Not on file  Relationships  . Social connections:    Talks on phone: Not on file    Gets together: Not on file    Attends religious service: Not on file    Active member of club or organization: Not on file    Attends meetings of clubs or organizations: Not on file    Relationship status: Not on file  Other Topics Concern  . Not on file  Social History Narrative  . Not on file    Outpatient Encounter Medications as of 04/26/2018  Medication Sig  . [DISCONTINUED] lisinopril (PRINIVIL,ZESTRIL) 10 MG tablet TAKE 1 TABLET BY MOUTH EVERY DAY  . losartan (COZAAR) 25 MG tablet Take 1 tablet (25 mg total) by mouth daily.   No facility-administered encounter medications on file as of 04/26/2018.     Review of Systems  Constitutional: Negative for appetite change and unexpected weight change.  HENT: Negative for congestion and sinus  pressure.   Respiratory: Positive for cough. Negative for chest tightness and shortness of breath.   Cardiovascular: Negative for chest pain, palpitations and leg swelling.  Gastrointestinal: Positive for abdominal pain. Negative for diarrhea, nausea and vomiting.  Genitourinary: Negative for difficulty urinating and dysuria.  Musculoskeletal: Negative for joint swelling and myalgias.  Skin: Negative for color change and rash.  Neurological: Negative for dizziness, light-headedness and headaches.  Psychiatric/Behavioral: Negative for agitation and dysphoric mood.       Objective:    Physical Exam  Constitutional: She appears well-developed and well-nourished. No distress.  HENT:  Nose: Nose normal.  Mouth/Throat: Oropharynx is clear and moist.  Neck: Neck supple. No thyromegaly present.  Cardiovascular: Normal rate and regular rhythm.  Pulmonary/Chest: Breath sounds normal. No respiratory  distress. She has no wheezes.  Abdominal: Soft. Bowel sounds are normal.  Minimal tenderness to palpation - epigastric region.    Musculoskeletal: She exhibits no edema or tenderness.  Lymphadenopathy:    She has no cervical adenopathy.  Skin: No rash noted. No erythema.  Psychiatric: She has a normal mood and affect. Her behavior is normal.    BP 128/78 (BP Location: Left Arm, Patient Position: Sitting, Cuff Size: Normal)   Pulse 77   Temp 98.1 F (36.7 C) (Oral)   Resp 16   Wt 157 lb 3.2 oz (71.3 kg)   SpO2 98%   BMI 27.85 kg/m  Wt Readings from Last 3 Encounters:  04/26/18 157 lb 3.2 oz (71.3 kg)  02/15/18 150 lb (68 kg)  12/21/17 152 lb 6 oz (69.1 kg)     Lab Results  Component Value Date   WBC 13.1 (H) 04/26/2018   HGB 12.4 04/26/2018   HCT 36.2 04/26/2018   PLT 269.0 04/26/2018   GLUCOSE 92 04/26/2018   CHOL 204 (H) 09/21/2017   TRIG 60.0 09/21/2017   HDL 74.70 09/21/2017   LDLCALC 118 (H) 09/21/2017   ALT 7 04/26/2018   AST 9 04/26/2018   NA 138 04/26/2018   K 4.6 04/26/2018   CL 105 04/26/2018   CREATININE 0.69 04/26/2018   BUN 14 04/26/2018   CO2 24 04/26/2018   TSH 2.53 09/21/2017    Mm Screening Breast Tomo Bilateral  Result Date: 11/02/2017 CLINICAL DATA:  Screening. EXAM: DIGITAL SCREENING BILATERAL MAMMOGRAM WITH TOMO AND CAD COMPARISON:  Previous exam(s). ACR Breast Density Category c: The breast tissue is heterogeneously dense, which may obscure small masses. FINDINGS: There are no findings suspicious for malignancy. Images were processed with CAD. IMPRESSION: No mammographic evidence of malignancy. A result letter of this screening mammogram will be mailed directly to the patient. RECOMMENDATION: Screening mammogram in one year. (Code:SM-B-01Y) BI-RADS CATEGORY  1: Negative. Electronically Signed   By: Ted Mcalpineobrinka  Dimitrova M.D.   On: 11/02/2017 12:29       Assessment & Plan:   Problem List Items Addressed This Visit    Epigastric pain     Epigastric pain as outlined.  Minimal pain noticed on exam.  Just started.  Better today.  Check routine labs including cbc, liver panel and amylase/lipase.  Follow.  If persistent pain, will require further testing/scan.  Start pepcid.  Call with update.        Relevant Orders   CBC with Differential/Platelet (Completed)   Hepatic function panel (Completed)   Basic metabolic panel (Completed)   Amylase (Completed)   Lipase (Completed)   Essential hypertension    Blood pressure doing well.  Has the cough as outlined.  Stop lisinopril.  Start losartan 25mg  q day.  Follow pressures.  Get her back in soon to reassess.        Relevant Medications   losartan (COZAAR) 25 MG tablet   Hypercholesterolemia    Low cholesterol diet and exercise.  Follow lipid panel.        Relevant Medications   losartan (COZAAR) 25 MG tablet       Dale [place], MD

## 2018-04-26 NOTE — Patient Instructions (Addendum)
pepcid 20mg  - take one tablet 30 minutes before breakfast  Call with update

## 2018-04-28 ENCOUNTER — Encounter: Payer: Self-pay | Admitting: Internal Medicine

## 2018-04-28 DIAGNOSIS — D72829 Elevated white blood cell count, unspecified: Secondary | ICD-10-CM

## 2018-04-28 NOTE — Assessment & Plan Note (Addendum)
Epigastric pain as outlined.  Minimal pain noticed on exam.  Just started.  Better today.  Check routine labs including cbc, liver panel and amylase/lipase.  Follow.  If persistent pain, will require further testing/scan.  Start pepcid.  Call with update.

## 2018-04-28 NOTE — Assessment & Plan Note (Signed)
Low cholesterol diet and exercise.  Follow lipid panel.   

## 2018-04-28 NOTE — Assessment & Plan Note (Signed)
Blood pressure doing well.  Has the cough as outlined.  Stop lisinopril.  Start losartan 25mg  q day.  Follow pressures.  Get her back in soon to reassess.

## 2018-04-29 NOTE — Telephone Encounter (Signed)
See my chart messages.  She had questions about her white blood cell count elevation.  This can be from infection, increased stress, etc.  With her feeling better, the only thing we need to do is repeat a cbc to confirm normalizes.  Please schedule non fasting lab over the next 7-10 days.

## 2018-04-29 NOTE — Telephone Encounter (Signed)
lmtcb OK for pec to advise

## 2018-05-05 ENCOUNTER — Other Ambulatory Visit: Payer: Self-pay | Admitting: Internal Medicine

## 2018-05-08 ENCOUNTER — Other Ambulatory Visit (INDEPENDENT_AMBULATORY_CARE_PROVIDER_SITE_OTHER): Payer: BLUE CROSS/BLUE SHIELD

## 2018-05-08 DIAGNOSIS — D72829 Elevated white blood cell count, unspecified: Secondary | ICD-10-CM

## 2018-05-09 LAB — CBC WITH DIFFERENTIAL/PLATELET
Basophils Absolute: 0.1 10*3/uL (ref 0.0–0.1)
Basophils Relative: 0.8 % (ref 0.0–3.0)
Eosinophils Absolute: 0.5 10*3/uL (ref 0.0–0.7)
Eosinophils Relative: 4.1 % (ref 0.0–5.0)
HCT: 37.3 % (ref 36.0–46.0)
Hemoglobin: 12.6 g/dL (ref 12.0–15.0)
LYMPHS PCT: 18.4 % (ref 12.0–46.0)
Lymphs Abs: 2.2 10*3/uL (ref 0.7–4.0)
MCHC: 33.8 g/dL (ref 30.0–36.0)
MCV: 93.1 fl (ref 78.0–100.0)
Monocytes Absolute: 0.6 10*3/uL (ref 0.1–1.0)
Monocytes Relative: 5.1 % (ref 3.0–12.0)
Neutro Abs: 8.7 10*3/uL — ABNORMAL HIGH (ref 1.4–7.7)
Neutrophils Relative %: 71.6 % (ref 43.0–77.0)
PLATELETS: 354 10*3/uL (ref 150.0–400.0)
RBC: 4.01 Mil/uL (ref 3.87–5.11)
RDW: 12.8 % (ref 11.5–15.5)
WBC: 12.1 10*3/uL — ABNORMAL HIGH (ref 4.0–10.5)

## 2018-05-10 ENCOUNTER — Other Ambulatory Visit: Payer: Self-pay | Admitting: Internal Medicine

## 2018-05-10 DIAGNOSIS — D72829 Elevated white blood cell count, unspecified: Secondary | ICD-10-CM

## 2018-05-10 NOTE — Progress Notes (Signed)
Order placed for f/u cbc.   

## 2018-05-20 ENCOUNTER — Ambulatory Visit: Payer: Self-pay

## 2018-05-20 MED ORDER — AMLODIPINE BESYLATE 2.5 MG PO TABS: 2.5000 mg | ORAL_TABLET | Freq: Every day | ORAL | 1 refills | Status: DC

## 2018-05-20 NOTE — Telephone Encounter (Signed)
Patient has been notified of the below from Dr Lorin Picketscott.  Patient had no questions, comments, nor concerns at this time.

## 2018-05-20 NOTE — Telephone Encounter (Signed)
Given that it was hives, I do not want her taking the losartan or the lisinopril that she was previously on.  There can be cross reaction of these medications.  I would like to start her on amlodipine 2.5mg  q day.  Follow pressures.  May need to increase the dose.  Confirm doing ok now.

## 2018-05-20 NOTE — Telephone Encounter (Signed)
Medication has been sent to pharmacy.  °

## 2018-05-20 NOTE — Addendum Note (Signed)
Addended by: Elise BenneBOOTH, Shep Porter T on: 05/20/2018 04:14 PM   Modules accepted: Orders

## 2018-05-20 NOTE — Telephone Encounter (Signed)
Summary: medication problem    Pt returning call from nurse ----- Message from Fanny BienJessica L [place]lderton sent at 05/20/2018 8:41 AM EST ----- Pt called and stated that she started taking losartan (COZAAR) 25 MG tablet [[id number]][[id number]] and had hives. Pt would like to know if she can go back to her old blood pressure medication. Please advise       Reason for Disposition . Caller has URGENT medication question about med that PCP prescribed and triager unable to answer question  Answer Assessment - Initial Assessment Questions 1. SYMPTOMS: "Do you have any symptoms?"     Patient was 10 days into new Rx 2. SEVERITY: If symptoms are present, ask "Are they mild, moderate or severe?"     Started having hives 10th day- patient stopped- medication on Friday morning- hives are gone. Patient did take Benadryl to stop the itching.  Protocols used: MEDICATION QUESTION CALL-A-AH  Patient wants to know if she needs to restart the old medication or try something else. BP yesterday- 120/80.  Patient is not having high BP symptoms. May leave message on voice mail- she is Sales executivedental assistant and may be working with patient.

## 2018-05-20 NOTE — Addendum Note (Signed)
Addended by: Elise BenneBOOTH, Ramar Nobrega T on: 05/20/2018 04:16 PM   Modules accepted: Orders

## 2018-06-07 ENCOUNTER — Other Ambulatory Visit (INDEPENDENT_AMBULATORY_CARE_PROVIDER_SITE_OTHER): Payer: PRIVATE HEALTH INSURANCE

## 2018-06-07 DIAGNOSIS — D72829 Elevated white blood cell count, unspecified: Secondary | ICD-10-CM

## 2018-06-07 LAB — CBC WITH DIFFERENTIAL/PLATELET
Basophils Absolute: 0.1 10*3/uL (ref 0.0–0.1)
Basophils Relative: 0.6 % (ref 0.0–3.0)
EOS PCT: 4.7 % (ref 0.0–5.0)
Eosinophils Absolute: 0.4 10*3/uL (ref 0.0–0.7)
HCT: 40.7 % (ref 36.0–46.0)
Hemoglobin: 14.1 g/dL (ref 12.0–15.0)
Lymphocytes Relative: 17.7 % (ref 12.0–46.0)
Lymphs Abs: 1.7 10*3/uL (ref 0.7–4.0)
MCHC: 34.6 g/dL (ref 30.0–36.0)
MCV: 91.9 fl (ref 78.0–100.0)
Monocytes Absolute: 0.6 10*3/uL (ref 0.1–1.0)
Monocytes Relative: 6 % (ref 3.0–12.0)
Neutro Abs: 6.7 10*3/uL (ref 1.4–7.7)
Neutrophils Relative %: 71 % (ref 43.0–77.0)
Platelets: 318 10*3/uL (ref 150.0–400.0)
RBC: 4.43 Mil/uL (ref 3.87–5.11)
RDW: 13 % (ref 11.5–15.5)
WBC: 9.5 10*3/uL (ref 4.0–10.5)

## 2018-06-08 ENCOUNTER — Encounter: Payer: Self-pay | Admitting: Internal Medicine

## 2018-06-23 ENCOUNTER — Telehealth: Payer: Self-pay | Admitting: Family

## 2018-06-23 DIAGNOSIS — R6889 Other general symptoms and signs: Secondary | ICD-10-CM

## 2018-06-23 DIAGNOSIS — Z20828 Contact with and (suspected) exposure to other viral communicable diseases: Secondary | ICD-10-CM

## 2018-06-23 NOTE — Progress Notes (Signed)
E visit for Flu like symptoms   We are sorry that you are not feeling well.  Here is how we plan to help! Based on what you have shared with me it looks like you may have possible exposure to a virus that causes influenza.  Influenza or "the flu" is   an infection caused by a respiratory virus. The flu virus is highly contagious and persons who did not receive their yearly flu vaccination may "catch" the flu from close contact.  We have anti-viral medications to treat the viruses that cause this infection. They are not a "cure" and only shorten the course of the infection. These prescriptions are most effective when they are given within the first 2 days of "flu" symptoms. Antiviral medication are indicated if you have a high risk of complications from the flu. You should  also consider an antiviral medication if you are in close contact with someone who is at risk. These medications can help patients avoid complications from the flu  but have side effects that you should know. Possible side effects from Tamiflu or oseltamivir include nausea, vomiting, diarrhea, dizziness, headaches, eye redness, sleep problems or other respiratory symptoms. You should not take Tamiflu if you have an allergy to oseltamivir or any to the ingredients in Tamiflu.  Based upon your symptoms and potential risk factors I recommend that you follow the flu symptoms recommendation that I have listed below.  ANYONE WHO HAS FLU SYMPTOMS SHOULD: . Stay home. The flu is highly contagious and going out or to work exposes others! . Be sure to drink plenty of fluids. Water is fine as well as fruit juices, sodas and electrolyte beverages. You may want to stay away from caffeine or alcohol. If you are nauseated, try taking small sips of liquids. How do you know if you are getting enough fluid? Your urine should be a pale yellow or almost colorless. . Get rest. . Taking a steamy shower or using a humidifier may help nasal congestion and  ease sore throat pain. Using a saline nasal spray works much the same way. . Cough drops, hard candies and sore throat lozenges may ease your cough. . Line up a caregiver. Have someone check on you regularly.   GET HELP RIGHT AWAY IF: . You cannot keep down liquids or your medications. . You become short of breath . Your fell like you are going to pass out or loose consciousness. . Your symptoms persist after you have completed your treatment plan MAKE SURE YOU   Understand these instructions.  Will watch your condition.  Will get help right away if you are not doing well or get worse.  Your e-visit answers were reviewed by a board certified advanced clinical practitioner to complete your personal care plan.  Depending on the condition, your plan could have included both over the counter or prescription medications.  If there is a problem please reply  once you have received a response from your provider.  Your safety is important to us.  If you have drug allergies check your prescription carefully.    You can use MyChart to ask questions about today's visit, request a non-urgent call back, or ask for a work or school excuse for 24 hours related to this e-Visit. If it has been greater than 24 hours you will need to follow up with your provider, or enter a new e-Visit to address those concerns.  You will get an e-mail in the next two days asking   about your experience.  I hope that your e-visit has been valuable and will speed your recovery. Thank you for using e-visits.  

## 2018-06-28 ENCOUNTER — Ambulatory Visit: Payer: BLUE CROSS/BLUE SHIELD | Admitting: Internal Medicine

## 2018-07-05 ENCOUNTER — Encounter: Payer: Self-pay | Admitting: Family Medicine

## 2018-07-05 ENCOUNTER — Ambulatory Visit (INDEPENDENT_AMBULATORY_CARE_PROVIDER_SITE_OTHER): Payer: PRIVATE HEALTH INSURANCE | Admitting: Family Medicine

## 2018-07-05 VITALS — BP 148/96 | HR 93 | Temp 98.9°F | Resp 18 | Ht 62.0 in | Wt 158.0 lb

## 2018-07-05 DIAGNOSIS — R05 Cough: Secondary | ICD-10-CM

## 2018-07-05 DIAGNOSIS — R058 Other specified cough: Secondary | ICD-10-CM

## 2018-07-05 MED ORDER — BENZONATATE 100 MG PO CAPS: 100.0000 mg | ORAL_CAPSULE | Freq: Three times a day (TID) | ORAL | 1 refills | Status: DC | PRN

## 2018-07-05 MED ORDER — HYDROCOD POLST-CPM POLST ER 10-8 MG/5ML PO SUER: 5.0000 mL | Freq: Two times a day (BID) | ORAL | 0 refills | Status: DC | PRN

## 2018-07-05 NOTE — Progress Notes (Signed)
Subjective:    Patient ID: Suzanne Garrison, female    DOB: 07/21/1970, 48 y.o.   MRN: [id number]  HPI   Patient presents to clinic due to cough for about 2 weeks.  Patient did have the flu around 2 February, was sick with high fever, body aches, cough.  States all of her other symptoms have resolved except for a lingering cough.  Sometimes will have white mucus, other times cough feels dry and hacking. Patient states she has tried almost everything over-the-counter shaking her hands on for cough and also some old prescriptions.  She has taken Mucinex, Sudafed, Delsym, cough drops, prednisone, codeine pill and cough continues to persist.  Denies fever or chills.  Denies wheezing or shortness of breath.  Denies chest pain.  Denies abdominal pain, nausea, diarrhea or vomiting.  Patient Active Problem List   Diagnosis Date Noted  . Epigastric pain 04/26/2018  . Essential hypertension 09/24/2016  . Chronic cough 10/11/2015  . Health care maintenance 09/13/2014  . Toenail fungus 09/01/2013  . Hypercholesterolemia 08/25/2012  . Abnormal Pap smear of cervix 08/25/2012  . Fibrocystic breast disease 08/25/2012   Social History   Tobacco Use  . Smoking status: Never Smoker  . Smokeless tobacco: Never Used  Substance Use Topics  . Alcohol use: Yes    Alcohol/week: 1.0 standard drinks    Types: 1 Shots of liquor per week    Comment: occasional   Review of Systems   Constitutional: Negative for chills, fatigue and fever.  HENT: Negative for congestion, ear pain, sinus pain and sore throat.   Eyes: Negative.   Respiratory: Negative for shortness of breath and wheezing. +cough   Cardiovascular: Negative for chest pain, palpitations and leg swelling.  Gastrointestinal: Negative for abdominal pain, diarrhea, nausea and vomiting.  Genitourinary: Negative for dysuria, frequency and urgency.  Musculoskeletal: Negative for arthralgias and myalgias.  Skin: Negative for color change, pallor and  rash.  Neurological: Negative for syncope, light-headedness and headaches.  Psychiatric/Behavioral: The patient is not nervous/anxious.       Objective:   Physical Exam Vitals signs and nursing note reviewed.  Constitutional:      General: She is not in acute distress.    Appearance: She is well-developed. She is not toxic-appearing.  HENT:     Head: Normocephalic and atraumatic.     Right Ear: Tympanic membrane, ear canal and external ear normal.     Left Ear: Tympanic membrane, ear canal and external ear normal.     Nose: Rhinorrhea (clear drainage and post nasal drip) present.     Mouth/Throat:     Mouth: Mucous membranes are moist.  Eyes:     General: No scleral icterus.       Right eye: No discharge.        Left eye: No discharge.     Extraocular Movements: Extraocular movements intact.     Conjunctiva/sclera: Conjunctivae normal.  Neck:     Musculoskeletal: Normal range of motion and neck supple. No neck rigidity.     Trachea: No tracheal deviation.  Cardiovascular:     Rate and Rhythm: Normal rate and regular rhythm.     Heart sounds: Normal heart sounds.  Pulmonary:     Effort: Pulmonary effort is normal. No respiratory distress.     Breath sounds: Normal breath sounds. No stridor. No wheezing, rhonchi or rales.  Chest:     Chest wall: No tenderness.  Lymphadenopathy:     Cervical: No cervical adenopathy.  Skin:    General: Skin is warm and dry.     Capillary Refill: Capillary refill takes less than 2 seconds.     Coloration: Skin is not jaundiced or pale.  Neurological:     Mental Status: She is alert and oriented to person, place, and time.     Cranial Nerves: No cranial nerve deficit.     Gait: Gait normal.  Psychiatric:        Mood and Affect: Mood normal.        Behavior: Behavior normal.     Vitals:   07/05/18 1131 07/05/18 1140  BP: (!) 168/108 (!) 148/96  Pulse: 93   Resp: 18   Temp: 98.9 F (37.2 C)   SpO2: 98%       Assessment & Plan:    Postviral cough syndrome - lungs are clear on exam and otherwise patient's physical exam is unremarkable.  Suspect lingering cough is related to her recent influenza.  She will use Tussionex cough syrup at night to help reduce cough symptoms, aware that this medication can cause drowsiness so to not take prior to driving.  She will use Tessalon Perles as needed during the day to help reduce cough.  Advised to avoid taking over-the-counter Sudafed, or contain the ingredients pseudoephedrine, ephedrine and phenylephrine because they are known to raise blood pressure.   Did offer chest x-ray in clinic today, but patient declines due to her lungs sounding clear on exam.  Advised patient that cough should slowly begin to decrease over the next 2 to 3 weeks.  If cough worsens or she decides she would like a chest x-ray, advised to return to clinic for reevaluation and we can get chest x-ray imaging at that time.

## 2018-07-05 NOTE — Patient Instructions (Addendum)
Cough, Adult  A cough helps to clear your throat and lungs. A cough may last only 2-3 weeks (acute), or it may last longer than 8 weeks (chronic). Many different things can cause a cough. A cough may be a sign of an illness or another medical condition. Follow these instructions at home:  Pay attention to any changes in your cough.  Take medicines only as told by your doctor.  Drink enough fluid to keep your pee (urine) clear or pale yellow.  If the air is dry, use a cold steam vaporizer or humidifier in your home.  Stay away from things that make you cough at work or at home.  If your cough is worse at night, try using extra pillows to raise your head up higher while you sleep.  Do not smoke, and try not to be around smoke. If you need help quitting, ask your doctor.  Do not have caffeine.  Do not drink alcohol.  Rest as needed. Contact a doctor if:  You have new problems (symptoms).  Your cough does not get better after 2-3 weeks, or your cough gets worse.  Medicine does not help your cough and you are not sleeping well.  You have pain that gets worse or pain that is not helped with medicine.  You have a fever.  You are losing weight and you do not know why.  You have night sweats. Get help right away if:  You cough up blood.  You have trouble breathing.  Your heartbeat is very fast. This information is not intended to replace advice given to you by your health care provider. Make sure you discuss any questions you have with your health care provider. Document Released: 01/19/2011 Document Revised: 10/14/2015 Document Reviewed: 07/15/2014 Elsevier Interactive Patient Education  2019 [employer].

## 2018-07-18 ENCOUNTER — Telehealth: Payer: Self-pay | Admitting: Internal Medicine

## 2018-07-18 DIAGNOSIS — R058 Other specified cough: Secondary | ICD-10-CM

## 2018-07-18 DIAGNOSIS — R05 Cough: Secondary | ICD-10-CM

## 2018-07-18 NOTE — Telephone Encounter (Signed)
Copied from CRM [id number]. Topic: Quick Communication - Rx Refill/Question >> Jul 18, 2018  4:26 PM Jens Som A wrote: Medication: chlorpheniramine-HYDROcodone Stevphen Meuse [facility] ER) 10-8 MG/5ML Kenton Kingfisher [[id number]] amLODipine (NORVASC) 2.5 MG tablet [[id number]]   Has the patient contacted their pharmacy? Yes  (Agent: If no, request that the patient contact the pharmacy for the refill.) (Agent: If yes, when and what did the pharmacy advise?)  Preferred Pharmacy (with phone number or street name): [id number] [place], [place] - [address] [phone] (Phone) [phone] (Fax)    Agent: Please be advised that RX refills may take up to 3 business days. We ask that you follow-up with your pharmacy.

## 2018-07-19 ENCOUNTER — Other Ambulatory Visit: Payer: Self-pay | Admitting: Internal Medicine

## 2018-07-19 DIAGNOSIS — Z1231 Encounter for screening mammogram for malignant neoplasm of breast: Secondary | ICD-10-CM

## 2018-07-19 MED ORDER — HYDROCOD POLST-CPM POLST ER 10-8 MG/5ML PO SUER: 5.0000 mL | Freq: Two times a day (BID) | ORAL | 0 refills | Status: DC | PRN

## 2018-07-19 NOTE — Telephone Encounter (Signed)
Pt aware.

## 2018-07-19 NOTE — Telephone Encounter (Signed)
Spoke with patient to confirm no other acute symptoms. She was having a productive cough and now it is just a dry cough. She says OTC cough medication does not work. She is using tessalon perles during the day and the tussionex at night. She is doing better but would like a refill on the tussionex. Sending to Lauren since she was seen for an acute visit and given this medication

## 2018-07-19 NOTE — Telephone Encounter (Signed)
Pt returning Trisha's call.  Please call pt.

## 2018-07-19 NOTE — Telephone Encounter (Signed)
LMTCB to get more information. Need to know why she is requesting refill on tussionex.

## 2018-07-23 ENCOUNTER — Other Ambulatory Visit: Payer: Self-pay | Admitting: Internal Medicine

## 2018-08-12 ENCOUNTER — Telehealth: Payer: Self-pay | Admitting: Internal Medicine

## 2018-08-12 NOTE — Telephone Encounter (Signed)
Copied from CRM [id number]. Topic: Quick Communication - Rx Refill/Question >> Aug 12, 2018  4:27 PM Jaquita Rector A wrote: Medication: amLODipine (NORVASC) 2.5 MG tablet   Has the patient contacted their pharmacy? Yes.   (Agent: If no, request that the patient contact the pharmacy for the refill.) (Agent: If yes, when and what did the pharmacy advise?)  Preferred Pharmacy (with phone number or street name): [id number] [place], [place] - [address] [phone] (Phone) [phone] (Fax)    Agent: Please be advised that RX refills may take up to 3 business days. We ask that you follow-up with your pharmacy.

## 2018-08-21 ENCOUNTER — Ambulatory Visit: Payer: PRIVATE HEALTH INSURANCE | Admitting: Internal Medicine

## 2018-09-18 ENCOUNTER — Other Ambulatory Visit: Payer: Self-pay | Admitting: Internal Medicine

## 2018-11-08 ENCOUNTER — Other Ambulatory Visit: Payer: Self-pay

## 2018-11-08 ENCOUNTER — Ambulatory Visit
Admission: RE | Admit: 2018-11-08 | Discharge: 2018-11-08 | Disposition: A | Discharge status: Home / Self Care | Payer: PRIVATE HEALTH INSURANCE | Source: Ambulatory Visit | Attending: Internal Medicine | Admitting: Internal Medicine

## 2018-11-08 DIAGNOSIS — Z1231 Encounter for screening mammogram for malignant neoplasm of breast: Secondary | ICD-10-CM | POA: Diagnosis not present

## 2018-11-16 ENCOUNTER — Other Ambulatory Visit: Payer: Self-pay | Admitting: Internal Medicine

## 2019-01-03 ENCOUNTER — Ambulatory Visit: Payer: PRIVATE HEALTH INSURANCE | Admitting: Internal Medicine

## 2019-02-03 ENCOUNTER — Other Ambulatory Visit: Payer: Self-pay

## 2019-02-03 ENCOUNTER — Ambulatory Visit (INDEPENDENT_AMBULATORY_CARE_PROVIDER_SITE_OTHER): Payer: PRIVATE HEALTH INSURANCE | Admitting: Internal Medicine

## 2019-02-03 VITALS — BP 130/88 | HR 82 | Temp 98.1°F | Resp 16 | Wt 164.2 lb

## 2019-02-03 DIAGNOSIS — Z0000 Encounter for general adult medical examination without abnormal findings: Secondary | ICD-10-CM | POA: Diagnosis not present

## 2019-02-03 DIAGNOSIS — R87619 Unspecified abnormal cytological findings in specimens from cervix uteri: Secondary | ICD-10-CM | POA: Diagnosis not present

## 2019-02-03 DIAGNOSIS — E7800 Pure hypercholesterolemia, unspecified: Secondary | ICD-10-CM

## 2019-02-03 DIAGNOSIS — I10 Essential (primary) hypertension: Secondary | ICD-10-CM | POA: Diagnosis not present

## 2019-02-03 NOTE — Progress Notes (Signed)
Patient ID: Suzanne Garrison, female   DOB: [birth year], 48 y.o.   MRN: [id number]   Subjective:    Patient ID: Suzanne Garrison, female    DOB: Feb 18, 1971, 48 y.o.   MRN: [id number]  HPI  Patient here for a scheduled physical.  She gets her breast, pap smears and pelvic exams through gyn.  Overall she feels she is doing relatively well.  Has gained some weight.  Discussed diet and exercise.  No chest pain. No sob. No acid reflux. No abdominal pain.  Bowels moving. Handling stress.     Past Medical History:  Diagnosis Date  . Fibrocystic breast disease   . History of abnormal Pap smear   . Hypercholesterolemia    History reviewed. No pertinent surgical history. Family History  Problem Relation Age of Onset  . Thyroid disease Maternal Grandmother   . Alcohol abuse Paternal Grandfather   . Cancer Paternal Grandfather        lung  . Thyroid disease Maternal Grandfather   . Colon cancer Neg Hx   . Breast cancer Neg Hx    Social History   Socioeconomic History  . Marital status: Divorced    Spouse name: Not on file  . Number of children: 0  . Years of education: Not on file  . Highest education level: Not on file  Occupational History    Employer: [employer]  Social Needs  . Financial resource strain: Not on file  . Food insecurity    Worry: Not on file    Inability: Not on file  . Transportation needs    Medical: Not on file    Non-medical: Not on file  Tobacco Use  . Smoking status: Never Smoker  . Smokeless tobacco: Never Used  Substance and Sexual Activity  . Alcohol use: Yes    Alcohol/week: 1.0 standard drinks    Types: 1 Shots of liquor per week    Comment: occasional  . Drug use: No  . Sexual activity: Not on file  Lifestyle  . Physical activity    Days per week: Not on file    Minutes per session: Not on file  . Stress: Not on file  Relationships  . Social Musician on phone: Not on file    Gets together: Not on file    Attends  religious service: Not on file    Active member of club or organization: Not on file    Attends meetings of clubs or organizations: Not on file    Relationship status: Not on file  Other Topics Concern  . Not on file  Social History Narrative  . Not on file    Outpatient Encounter Medications as of 02/03/2019  Medication Sig  . amLODipine (NORVASC) 2.5 MG tablet TAKE 1 TABLET BY MOUTH EVERY DAY  . [DISCONTINUED] benzonatate (TESSALON) 100 MG capsule Take 1 capsule (100 mg total) by mouth 3 (three) times daily as needed for cough.  . [DISCONTINUED] chlorpheniramine-HYDROcodone (TUSSIONEX PENNKINETIC ER) 10-8 MG/5ML SUER Take 5 mLs by mouth every 12 (twelve) hours as needed.   No facility-administered encounter medications on file as of 02/03/2019.     Review of Systems  Constitutional: Negative for appetite change and unexpected weight change.  HENT: Negative for congestion and sinus pressure.   Eyes: Negative for pain and visual disturbance.  Respiratory: Negative for cough, chest tightness and shortness of breath.   Cardiovascular: Negative for chest pain, palpitations and leg swelling.  Gastrointestinal: Negative for abdominal pain, diarrhea, nausea and vomiting.  Genitourinary: Negative for difficulty urinating and dysuria.  Musculoskeletal: Negative for joint swelling and myalgias.  Skin: Negative for color change and rash.  Neurological: Negative for dizziness, light-headedness and headaches.  Hematological: Negative for adenopathy. Does not bruise/bleed easily.  Psychiatric/Behavioral: Negative for agitation and dysphoric mood.       Objective:    Physical Exam Constitutional:      General: She is not in acute distress.    Appearance: Normal appearance.  HENT:     Right Ear: External ear normal.     Left Ear: External ear normal.  Eyes:     General: No scleral icterus.       Right eye: No discharge.        Left eye: No discharge.     Conjunctiva/sclera:  Conjunctivae normal.  Neck:     Musculoskeletal: Neck supple. No muscular tenderness.     Thyroid: No thyromegaly.  Cardiovascular:     Rate and Rhythm: Normal rate and regular rhythm.  Pulmonary:     Effort: No respiratory distress.     Breath sounds: Normal breath sounds. No wheezing.  Abdominal:     General: Bowel sounds are normal.     Palpations: Abdomen is soft.     Tenderness: There is no abdominal tenderness.  Genitourinary:    Comments: Performed by gyn.   Musculoskeletal:        General: No swelling or tenderness.  Lymphadenopathy:     Cervical: No cervical adenopathy.  Skin:    Findings: No erythema or rash.  Neurological:     General: No focal deficit present.     Mental Status: She is alert.  Psychiatric:        Mood and Affect: Mood normal.        Behavior: Behavior normal.     BP 130/88   Pulse 82   Temp 98.1 F (36.7 C)   Resp 16   Wt 164 lb 3.2 oz (74.5 kg)   SpO2 98%   BMI 30.03 kg/m  Wt Readings from Last 3 Encounters:  02/03/19 164 lb 3.2 oz (74.5 kg)  07/05/18 158 lb (71.7 kg)  04/26/18 157 lb 3.2 oz (71.3 kg)     Lab Results  Component Value Date   WBC 9.5 06/07/2018   HGB 14.1 06/07/2018   HCT 40.7 06/07/2018   PLT 318.0 06/07/2018   GLUCOSE 92 04/26/2018   CHOL 204 (H) 09/21/2017   TRIG 60.0 09/21/2017   HDL 74.70 09/21/2017   LDLCALC 118 (H) 09/21/2017   ALT 7 04/26/2018   AST 9 04/26/2018   NA 138 04/26/2018   K 4.6 04/26/2018   CL 105 04/26/2018   CREATININE 0.69 04/26/2018   BUN 14 04/26/2018   CO2 24 04/26/2018   TSH 2.53 09/21/2017    Mm 3d Screen Breast Bilateral  Result Date: 11/08/2018 CLINICAL DATA:  Screening. EXAM: DIGITAL SCREENING BILATERAL MAMMOGRAM WITH TOMO AND CAD COMPARISON:  Previous exam(s). ACR Breast Density Category b: There are scattered areas of fibroglandular density. FINDINGS: There are no findings suspicious for malignancy. Images were processed with CAD. IMPRESSION: No mammographic evidence of  malignancy. A result letter of this screening mammogram will be mailed directly to the patient. RECOMMENDATION: Screening mammogram in one year. (Code:SM-B-01Y) BI-RADS CATEGORY  1: Negative. Electronically Signed   By: Baird Lyonsina  Arceo M.D.   On: 11/08/2018 13:05       Assessment & Plan:  Problem List Items Addressed This Visit    Abnormal Pap smear of cervix    Followed by gyn.  Up to date.  Obtain records.        Essential hypertension    Blood pressure under good control.  Continue same medication regimen.  Follow pressures.  Follow metabolic panel.        Relevant Orders   Comprehensive metabolic panel   TSH   Health care maintenance    Schedule for physical today.  Sees gyn.  Mammogram 11/08/18 - Birads I.        Hypercholesterolemia    Low cholesterol diet and exercise.  Follow lipid panel.        Relevant Orders   Lipid panel       Einar Pheasant, MD

## 2019-02-09 ENCOUNTER — Encounter: Payer: Self-pay | Admitting: Internal Medicine

## 2019-02-09 NOTE — Assessment & Plan Note (Signed)
Blood pressure under good control.  Continue same medication regimen.  Follow pressures.  Follow metabolic panel.   

## 2019-02-09 NOTE — Assessment & Plan Note (Signed)
Schedule for physical today.  Sees gyn.  Mammogram 11/08/18 - Birads I.

## 2019-02-09 NOTE — Assessment & Plan Note (Signed)
Low cholesterol diet and exercise.  Follow lipid panel.   

## 2019-02-09 NOTE — Assessment & Plan Note (Signed)
Followed by gyn.  Up to date.  Obtain records.

## 2019-02-21 ENCOUNTER — Other Ambulatory Visit: Payer: PRIVATE HEALTH INSURANCE

## 2019-05-06 ENCOUNTER — Other Ambulatory Visit: Payer: Self-pay | Admitting: Internal Medicine

## 2019-08-08 ENCOUNTER — Ambulatory Visit: Payer: PRIVATE HEALTH INSURANCE | Admitting: Internal Medicine

## 2019-08-29 ENCOUNTER — Ambulatory Visit: Payer: 59 | Admitting: Internal Medicine

## 2019-08-29 ENCOUNTER — Encounter: Payer: Self-pay | Admitting: Internal Medicine

## 2019-08-29 ENCOUNTER — Other Ambulatory Visit: Payer: Self-pay

## 2019-08-29 VITALS — BP 126/70 | HR 81 | Temp 98.1°F | Resp 16 | Wt 132.8 lb

## 2019-08-29 DIAGNOSIS — Z1231 Encounter for screening mammogram for malignant neoplasm of breast: Secondary | ICD-10-CM

## 2019-08-29 DIAGNOSIS — E7800 Pure hypercholesterolemia, unspecified: Secondary | ICD-10-CM

## 2019-08-29 DIAGNOSIS — I10 Essential (primary) hypertension: Secondary | ICD-10-CM | POA: Diagnosis not present

## 2019-08-29 DIAGNOSIS — Z1211 Encounter for screening for malignant neoplasm of colon: Secondary | ICD-10-CM

## 2019-08-29 LAB — COMPREHENSIVE METABOLIC PANEL
ALT: 11 U/L (ref 0–35)
AST: 13 U/L (ref 0–37)
Albumin: 4.2 g/dL (ref 3.5–5.2)
Alkaline Phosphatase: 54 U/L (ref 39–117)
BUN: 18 mg/dL (ref 6–23)
CO2: 27 mEq/L (ref 19–32)
Calcium: 9 mg/dL (ref 8.4–10.5)
Chloride: 105 mEq/L (ref 96–112)
Creatinine, Ser: 0.67 mg/dL (ref 0.40–1.20)
GFR: 93.28 mL/min (ref >60.00)
Glucose, Bld: 91 mg/dL (ref 70–99)
Potassium: 4.6 mEq/L (ref 3.5–5.1)
Sodium: 138 mEq/L (ref 135–145)
Total Bilirubin: 0.5 mg/dL (ref 0.2–1.2)
Total Protein: 6.5 g/dL (ref 6.0–8.3)

## 2019-08-29 LAB — LIPID PANEL
Cholesterol: 180 mg/dL (ref 0–200)
HDL: 70.9 mg/dL (ref >39.00)
LDL Cholesterol: 101 mg/dL — ABNORMAL HIGH (ref 0–99)
NonHDL: 109.47
Total CHOL/HDL Ratio: 3
Triglycerides: 40 mg/dL (ref 0.0–149.0)
VLDL: 8 mg/dL (ref 0.0–40.0)

## 2019-08-29 LAB — CBC WITH DIFFERENTIAL/PLATELET
Basophils Absolute: 0.1 10*3/uL (ref 0.0–0.1)
Basophils Relative: 0.9 % (ref 0.0–3.0)
Eosinophils Absolute: 0.1 10*3/uL (ref 0.0–0.7)
Eosinophils Relative: 1.4 % (ref 0.0–5.0)
HCT: 37.4 % (ref 36.0–46.0)
Hemoglobin: 12.6 g/dL (ref 12.0–15.0)
Lymphocytes Relative: 19 % (ref 12.0–46.0)
Lymphs Abs: 1.3 10*3/uL (ref 0.7–4.0)
MCHC: 33.8 g/dL (ref 30.0–36.0)
MCV: 95.4 fl (ref 78.0–100.0)
Monocytes Absolute: 0.5 10*3/uL (ref 0.1–1.0)
Monocytes Relative: 6.8 % (ref 3.0–12.0)
Neutro Abs: 4.7 10*3/uL (ref 1.4–7.7)
Neutrophils Relative %: 71.9 % (ref 43.0–77.0)
Platelets: 248 10*3/uL (ref 150.0–400.0)
RBC: 3.92 Mil/uL (ref 3.87–5.11)
RDW: 13.5 % (ref 11.5–15.5)
WBC: 6.6 10*3/uL (ref 4.0–10.5)

## 2019-08-29 LAB — TSH: TSH: 2.06 u[IU]/mL (ref 0.35–4.50)

## 2019-08-29 NOTE — Progress Notes (Addendum)
Patient ID: Suzanne Garrison, female   DOB: 12-Jun-1970, 49 y.o.   MRN: [id number]   Subjective:    Patient ID: Suzanne Garrison, female    DOB: 01/04/1971, 49 y.o.   MRN: [id number]  HPI This visit occurred during the SARS-CoV-2 public health emergency.  Safety protocols were in place, including screening questions prior to the visit, additional usage of staff PPE, and extensive cleaning of exam room while observing appropriate contact time as indicated for disinfecting solutions.  Patient here for a scheduled follow up.  She reports she is doing well. Has adjusted her diet.  Previously took phentermine.  Off now.  Has lost weight. Feels better.  Exercising.  No acid reflux now.  Decreased snoring.  No chest pain or sob.  No abdominal pain.  Bowels moving.  On amlodipine.  Blood pressures doing well.  Reviewed outside readings - 106-123/70s.  Had covid 06/2019.  No residual problems.      Past Medical History:  Diagnosis Date  . Fibrocystic breast disease   . History of abnormal Pap smear   . Hypercholesterolemia    History reviewed. No pertinent surgical history. Family History  Problem Relation Age of Onset  . Thyroid disease Maternal Grandmother   . Alcohol abuse Paternal Grandfather   . Cancer Paternal Grandfather        lung  . Thyroid disease Maternal Grandfather   . Colon cancer Neg Hx   . Breast cancer Neg Hx    Social History   Socioeconomic History  . Marital status: Divorced    Spouse name: Not on file  . Number of children: 0  . Years of education: Not on file  . Highest education level: Not on file  Occupational History    Employer: [employer]  Tobacco Use  . Smoking status: Never Smoker  . Smokeless tobacco: Never Used  Substance and Sexual Activity  . Alcohol use: Yes    Alcohol/week: 1.0 standard drinks    Types: 1 Shots of liquor per week    Comment: occasional  . Drug use: No  . Sexual activity: Not on file  Other Topics Concern  . Not on file    Social History Narrative  . Not on file   Social Determinants of Health   Financial Resource Strain:   . Difficulty of Paying Living Expenses:   Food Insecurity:   . Worried About Charity fundraiser in the Last Year:   . Arboriculturist in the Last Year:   Transportation Needs:   . Film/video editor (Medical):   Marland Kitchen Lack of Transportation (Non-Medical):   Physical Activity:   . Days of Exercise per Week:   . Minutes of Exercise per Session:   Stress:   . Feeling of Stress :   Social Connections:   . Frequency of Communication with Friends and Family:   . Frequency of Social Gatherings with Friends and Family:   . Attends Religious Services:   . Active Member of Clubs or Organizations:   . Attends Archivist Meetings:   Marland Kitchen Marital Status:     Outpatient Encounter Medications as of 08/29/2019  Medication Sig  . amLODipine (NORVASC) 2.5 MG tablet TAKE 1 TABLET BY MOUTH EVERY DAY   No facility-administered encounter medications on file as of 08/29/2019.    Review of Systems  Constitutional: Negative for appetite change and unexpected weight change.  HENT: Negative for congestion and sinus pressure.   Respiratory:  Negative for cough, chest tightness and shortness of breath.   Cardiovascular: Negative for chest pain, palpitations and leg swelling.  Gastrointestinal: Negative for abdominal pain, diarrhea, nausea and vomiting.  Genitourinary: Negative for difficulty urinating and dysuria.  Musculoskeletal: Negative for joint swelling and myalgias.  Skin: Negative for color change and rash.  Neurological: Negative for dizziness, light-headedness and headaches.  Psychiatric/Behavioral: Negative for agitation and dysphoric mood.       Objective:    Physical Exam Constitutional:      General: She is not in acute distress.    Appearance: Normal appearance.  HENT:     Head: Normocephalic and atraumatic.     Right Ear: External ear normal.     Left Ear: External  ear normal.  Eyes:     General: No scleral icterus.       Right eye: No discharge.        Left eye: No discharge.     Conjunctiva/sclera: Conjunctivae normal.  Neck:     Thyroid: No thyromegaly.  Cardiovascular:     Rate and Rhythm: Normal rate and regular rhythm.  Pulmonary:     Effort: No respiratory distress.     Breath sounds: Normal breath sounds. No wheezing.  Abdominal:     General: Bowel sounds are normal.     Palpations: Abdomen is soft.     Tenderness: There is no abdominal tenderness.  Musculoskeletal:        General: No swelling or tenderness.     Cervical back: Neck supple. No tenderness.  Lymphadenopathy:     Cervical: No cervical adenopathy.  Skin:    Findings: No erythema or rash.  Neurological:     Mental Status: She is alert.  Psychiatric:        Mood and Affect: Mood normal.        Behavior: Behavior normal.     BP 126/70   Pulse 81   Temp 98.1 F (36.7 C)   Resp 16   Wt 132 lb 12.8 oz (60.2 kg)   SpO2 99%   BMI 24.29 kg/m  Wt Readings from Last 3 Encounters:  08/29/19 132 lb 12.8 oz (60.2 kg)  02/03/19 164 lb 3.2 oz (74.5 kg)  07/05/18 158 lb (71.7 kg)     Lab Results  Component Value Date   WBC 6.6 08/29/2019   HGB 12.6 08/29/2019   HCT 37.4 08/29/2019   PLT 248.0 08/29/2019   GLUCOSE 91 08/29/2019   CHOL 180 08/29/2019   TRIG 40.0 08/29/2019   HDL 70.90 08/29/2019   LDLCALC 101 (H) 08/29/2019   ALT 11 08/29/2019   AST 13 08/29/2019   NA 138 08/29/2019   K 4.6 08/29/2019   CL 105 08/29/2019   CREATININE 0.67 08/29/2019   BUN 18 08/29/2019   CO2 27 08/29/2019   TSH 2.06 08/29/2019    MM 3D SCREEN BREAST BILATERAL  Result Date: 11/08/2018 CLINICAL DATA:  Screening. EXAM: DIGITAL SCREENING BILATERAL MAMMOGRAM WITH TOMO AND CAD COMPARISON:  Previous exam(s). ACR Breast Density Category b: There are scattered areas of fibroglandular density. FINDINGS: There are no findings suspicious for malignancy. Images were processed with  CAD. IMPRESSION: No mammographic evidence of malignancy. A result letter of this screening mammogram will be mailed directly to the patient. RECOMMENDATION: Screening mammogram in one year. (Code:SM-B-01Y) BI-RADS CATEGORY  1: Negative. Electronically Signed   By: Baird Lyons M.D.   On: 11/08/2018 13:05       Assessment & Plan:  Problem List Items Addressed This Visit    Colon cancer screening    Due colon cancer screening.       Essential hypertension    Blood pressure doing well.  On amlodipine.  Follow pressures.  Follow metabolic panel.       Relevant Orders   Comprehensive metabolic panel (Completed)   CBC with Differential/Platelet (Completed)   TSH (Completed)   Hypercholesterolemia    Has adjusted her diet.  Lost weight.  Feels better.  Follow lipid panel.       Relevant Orders   Lipid panel (Completed)    Other Visit Diagnoses    Encounter for screening mammogram for malignant neoplasm of breast    -  Primary   Relevant Orders   MM 3D SCREEN BREAST BILATERAL       Dale Chester, MD

## 2019-08-31 ENCOUNTER — Encounter: Payer: Self-pay | Admitting: Internal Medicine

## 2019-08-31 DIAGNOSIS — Z1211 Encounter for screening for malignant neoplasm of colon: Secondary | ICD-10-CM | POA: Insufficient documentation

## 2019-08-31 NOTE — Assessment & Plan Note (Signed)
Blood pressure doing well.   On amlodipine. Follow pressures.  Follow metabolic panel.  ?

## 2019-08-31 NOTE — Assessment & Plan Note (Signed)
Due colon cancer screening

## 2019-08-31 NOTE — Assessment & Plan Note (Signed)
Has adjusted her diet.  Lost weight.  Feels better.  Follow lipid panel.

## 2019-09-03 ENCOUNTER — Encounter: Payer: Self-pay | Admitting: Internal Medicine

## 2019-11-06 ENCOUNTER — Encounter: Payer: Self-pay | Admitting: Internal Medicine

## 2020-03-01 ENCOUNTER — Ambulatory Visit: Payer: 59 | Admitting: Internal Medicine

## 2020-03-21 ENCOUNTER — Other Ambulatory Visit: Payer: Self-pay | Admitting: Internal Medicine

## 2020-07-06 ENCOUNTER — Ambulatory Visit
Admission: RE | Admit: 2020-07-06 | Discharge: 2020-07-06 | Disposition: A | Discharge status: Home / Self Care | Payer: 59 | Source: Ambulatory Visit | Attending: Internal Medicine | Admitting: Internal Medicine

## 2020-07-06 ENCOUNTER — Other Ambulatory Visit: Payer: Self-pay

## 2020-07-06 DIAGNOSIS — Z1231 Encounter for screening mammogram for malignant neoplasm of breast: Secondary | ICD-10-CM | POA: Diagnosis present

## 2020-07-13 ENCOUNTER — Telehealth: Payer: Self-pay | Admitting: Internal Medicine

## 2020-07-13 NOTE — Telephone Encounter (Signed)
Needs f/u appt scheduled.  Overdue.  Thanks

## 2020-07-13 NOTE — Telephone Encounter (Signed)
Lm to call back and schedule follow up

## 2020-12-18 ENCOUNTER — Other Ambulatory Visit: Payer: Self-pay | Admitting: Internal Medicine

## 2021-10-15 ENCOUNTER — Other Ambulatory Visit: Payer: Self-pay | Admitting: Internal Medicine

## 2021-11-14 IMAGING — MG MM DIGITAL SCREENING BILAT W/ TOMO AND CAD
8 series · 9 of 24 positions shown · non-contrast
Comparison: Previous exam(s).

CLINICAL DATA: Screening.

EXAM:
DIGITAL SCREENING BILATERAL MAMMOGRAM WITH TOMOSYNTHESIS AND CAD
TECHNIQUE: Bilateral screening digital craniocaudal and mediolateral oblique
mammograms were obtained. Bilateral screening digital breast
tomosynthesis was performed. The images were evaluated with
computer-aided detection.

[L CC synth-2D]
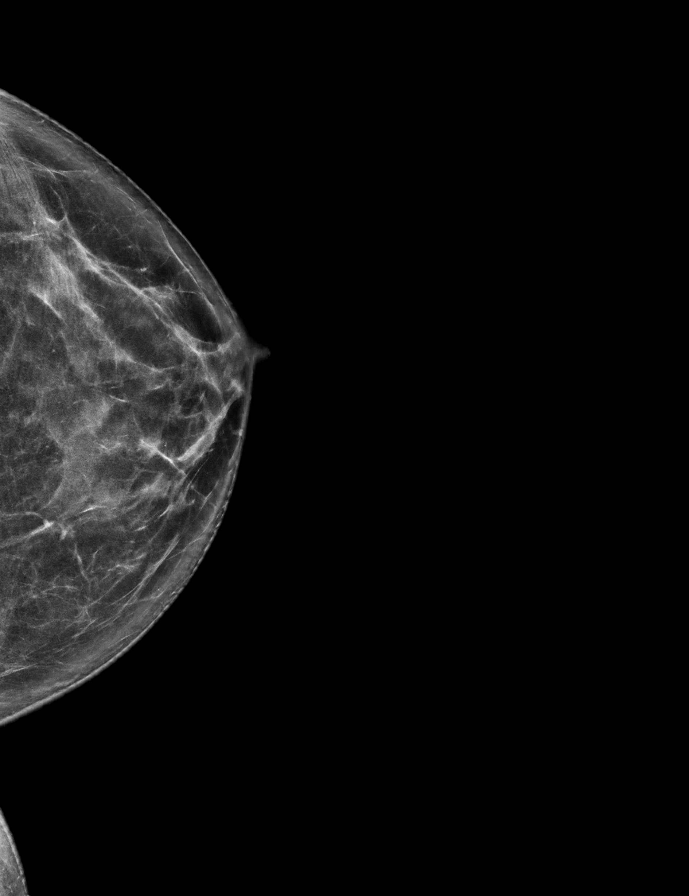

[L MLO synth-2D]
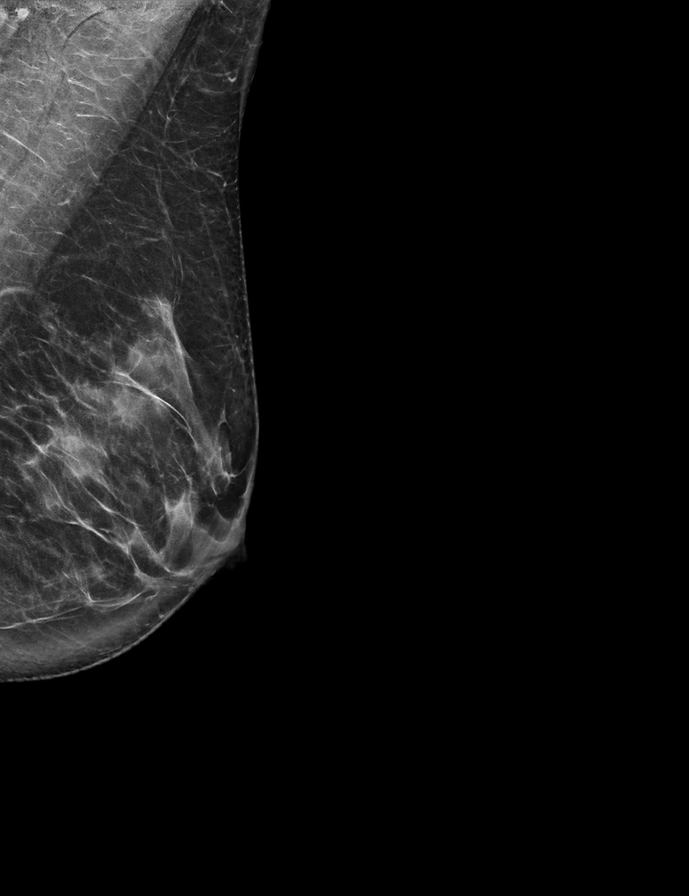

[R MLO synth-2D]
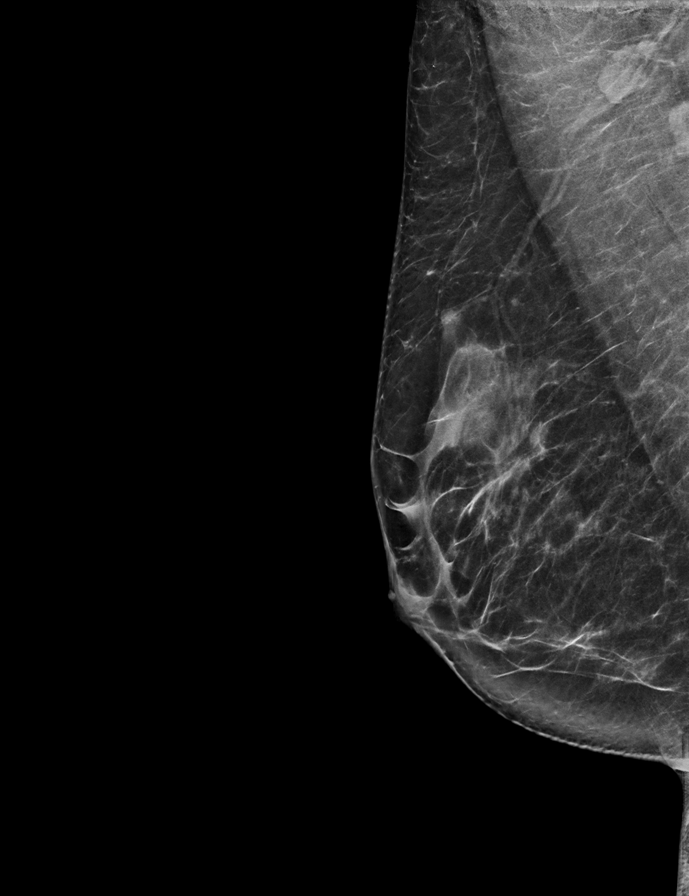

[R CC synth-2D]
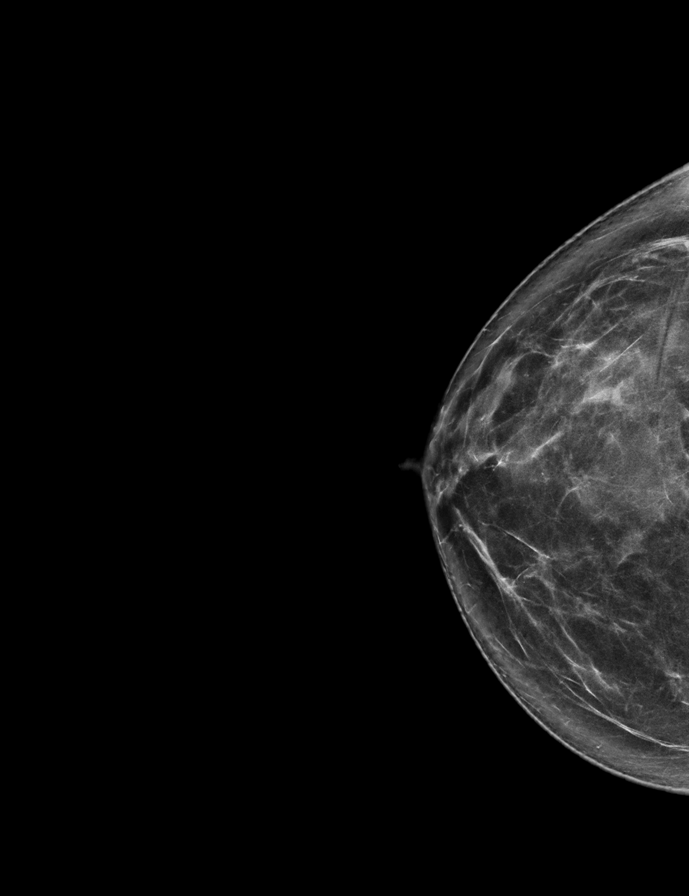

[R MLO tomo · 2 of 77 frames shown]
[frame 25/77]
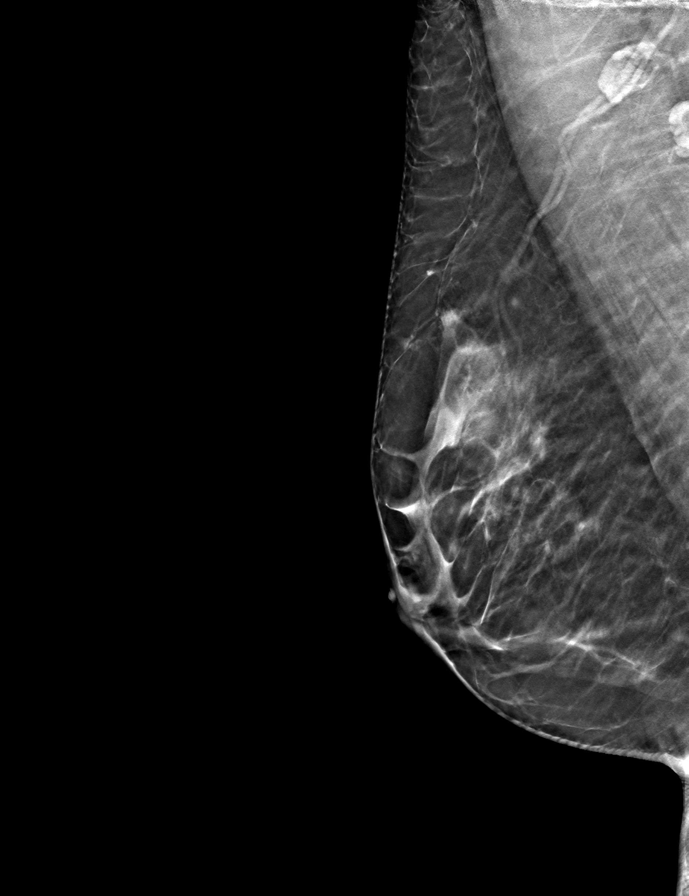
[frame 39/77]
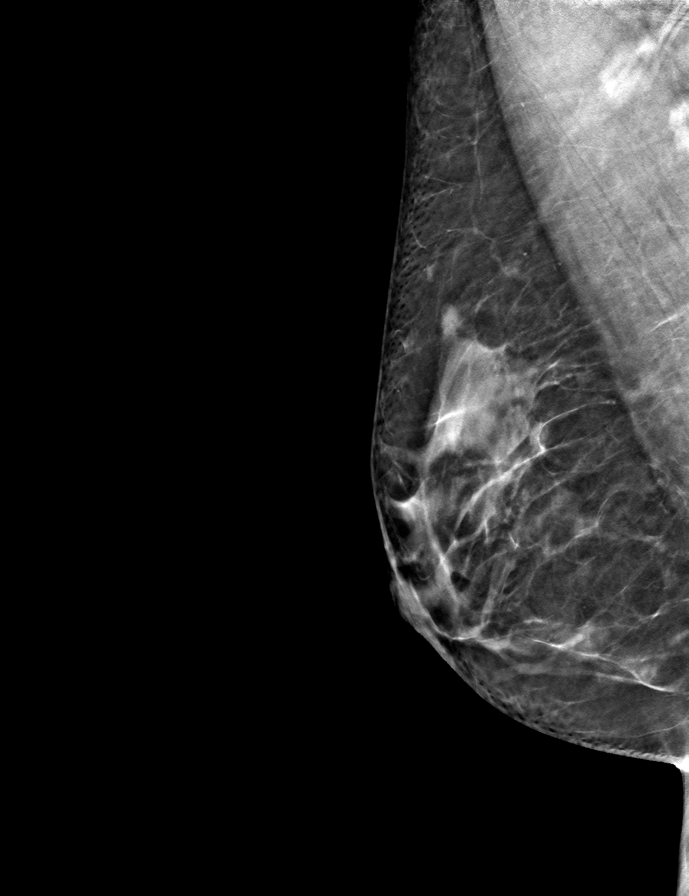

[L CC tomo · tomo slice 35/68.0]
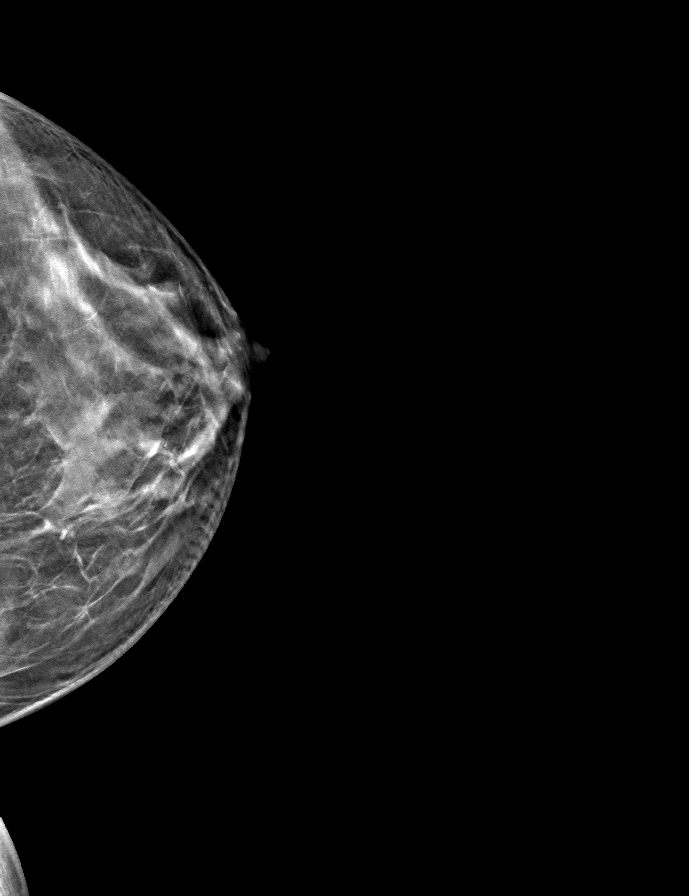

[L MLO tomo · tomo slice 39/76.0]
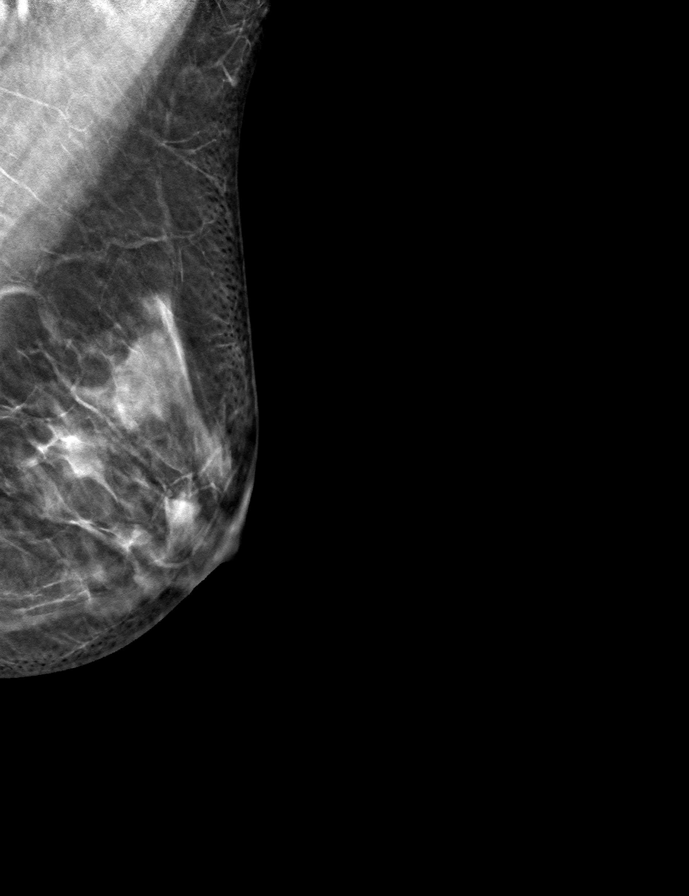

[R CC tomo · tomo slice 38/75.0]
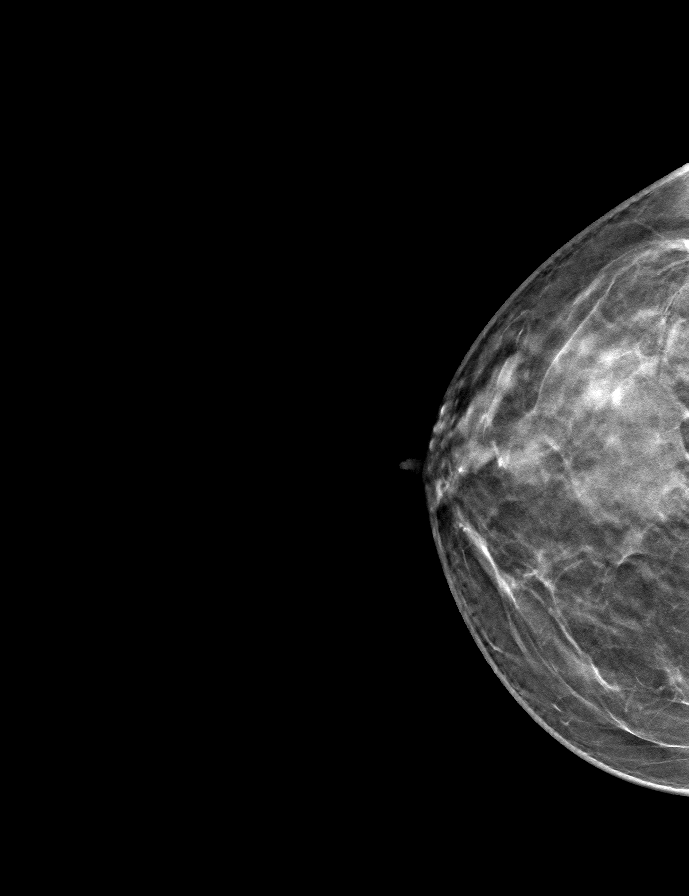

[9 of 24 positions shown; findings below may reference images not displayed]

ACR Breast Density Category c: The breast tissue is heterogeneously
dense, which may obscure small masses.
FINDINGS: There are no findings suspicious for malignancy.
IMPRESSION: No mammographic evidence of malignancy. A result letter of this
screening mammogram will be mailed directly to the patient.

RECOMMENDATION:
Screening mammogram in one year. (Code:[id number]-BC3)

BI-RADS CATEGORY  1: Negative.

## 2021-11-17 ENCOUNTER — Other Ambulatory Visit: Payer: Self-pay | Admitting: Family Medicine

## 2021-11-17 DIAGNOSIS — Z1231 Encounter for screening mammogram for malignant neoplasm of breast: Secondary | ICD-10-CM

## 2022-04-12 DIAGNOSIS — D2271 Melanocytic nevi of right lower limb, including hip: Secondary | ICD-10-CM | POA: Diagnosis not present

## 2022-04-12 DIAGNOSIS — D2262 Melanocytic nevi of left upper limb, including shoulder: Secondary | ICD-10-CM | POA: Diagnosis not present

## 2022-04-12 DIAGNOSIS — D225 Melanocytic nevi of trunk: Secondary | ICD-10-CM | POA: Diagnosis not present

## 2022-04-12 DIAGNOSIS — L814 Other melanin hyperpigmentation: Secondary | ICD-10-CM | POA: Diagnosis not present

## 2022-04-12 DIAGNOSIS — D2261 Melanocytic nevi of right upper limb, including shoulder: Secondary | ICD-10-CM | POA: Diagnosis not present

## 2022-04-12 DIAGNOSIS — L578 Other skin changes due to chronic exposure to nonionizing radiation: Secondary | ICD-10-CM | POA: Diagnosis not present

## 2022-04-12 DIAGNOSIS — D2272 Melanocytic nevi of left lower limb, including hip: Secondary | ICD-10-CM | POA: Diagnosis not present

## 2022-08-02 ENCOUNTER — Ambulatory Visit
Admission: RE | Admit: 2022-08-02 | Discharge: 2022-08-02 | Disposition: A | Discharge status: Home / Self Care | Payer: 59 | Source: Ambulatory Visit | Attending: Family Medicine | Admitting: Family Medicine

## 2022-08-02 DIAGNOSIS — Z1231 Encounter for screening mammogram for malignant neoplasm of breast: Secondary | ICD-10-CM

## 2022-09-29 ENCOUNTER — Ambulatory Visit: Payer: Self-pay | Admitting: Family Medicine

## 2022-10-05 ENCOUNTER — Encounter: Payer: Self-pay | Admitting: Family Medicine

## 2022-10-05 ENCOUNTER — Ambulatory Visit: Payer: 59 | Admitting: Family Medicine

## 2022-10-05 ENCOUNTER — Other Ambulatory Visit: Payer: Self-pay | Admitting: Family Medicine

## 2022-10-05 VITALS — BP 134/70 | HR 85 | Ht 62.0 in | Wt 142.0 lb

## 2022-10-05 DIAGNOSIS — I10 Essential (primary) hypertension: Secondary | ICD-10-CM | POA: Diagnosis not present

## 2022-10-05 DIAGNOSIS — Z1211 Encounter for screening for malignant neoplasm of colon: Secondary | ICD-10-CM

## 2022-10-05 DIAGNOSIS — Z0000 Encounter for general adult medical examination without abnormal findings: Secondary | ICD-10-CM

## 2022-10-05 DIAGNOSIS — Z1159 Encounter for screening for other viral diseases: Secondary | ICD-10-CM

## 2022-10-05 DIAGNOSIS — Z7689 Persons encountering health services in other specified circumstances: Secondary | ICD-10-CM

## 2022-10-05 DIAGNOSIS — E559 Vitamin D deficiency, unspecified: Secondary | ICD-10-CM

## 2022-10-05 DIAGNOSIS — E7800 Pure hypercholesterolemia, unspecified: Secondary | ICD-10-CM

## 2022-10-05 DIAGNOSIS — R7309 Other abnormal glucose: Secondary | ICD-10-CM

## 2022-10-05 NOTE — Patient Instructions (Addendum)
Thank you for coming to the office today.  Alternative suggestions for BP medications  Valsartan (similar to previous one you tried, it does not cause cough. Will look into this)  Hydrochlorothaizide (fluid pill, less excited about this)  -------------------  Ordered the Cologuard (home kit) test for colon cancer screening. Stay tuned for further updates.  It will be shipped to you directly. If not received in 2-4 weeks, call us or the company.   If you send it back and no results are received in 2-4 weeks, call us or the company as well!   Colon Cancer Screening: - For all adults age 53+ routine colon cancer screening is highly recommended.     - Recent guidelines from American [employer] Society recommend starting age of 53 - Early detection of colon cancer is important, because often there are no warning signs or symptoms, also if found early usually it can be cured. Late stage is hard to treat.   - If Cologuard is NEGATIVE, then it is good for 3 years before next due - If Cologuard is POSITIVE, then it is strongly advised to get a Colonoscopy, which allows the GI doctor to locate the source of the cancer or polyp (even very early stage) and treat it by removing it. ------------------------- Follow instructions to collect sample, you may call the company for any help or questions, 24/7 telephone support at [phone].  ----------------------------------   DUE for FASTING BLOOD WORK (no food or drink after midnight before the lab appointment, only water or coffee without cream/sugar on the morning of)  SCHEDULE "Lab Only" visit in the morning at the clinic for lab draw in 4 weeks  - Make sure Lab Only appointment is at about 1 week before your next appointment, so that results will be available  For Lab Results, once available within 2-3 days of blood draw, you can can log in to MyChart online to view your results and a brief explanation. Also, we can discuss results at next  follow-up visit.    Please schedule a Follow-up Appointment to: Return in about 4 weeks (around 11/02/2022) for 3-4 weeks fasting lab only 1st in AM, then 1 week later Annual Physical Friday AM preferred.  If you have any other questions or concerns, please feel free to call the office or send a message through MyChart. You may also schedule an earlier appointment if necessary.  Additionally, you may be receiving a survey about your experience at our office within a few days to 1 week by e-mail or mail. We value your feedback.  Saralyn Pilar, DO [facility], [place]

## 2022-10-05 NOTE — Assessment & Plan Note (Signed)
Home BP readings controlled. Initial elevated BP reading here No known complications  Prior failure Lisinopril 10mg  (cough ACEi), has been on Losartan temporarily in past. Unsure reason off.   Plan:  1. Continue current BP regimen Amlodipine 5mg  daily - discussed may have mild ankle swelling from this, but has been on for years, not significant swelling concern. Discussed alternative options limited. 2. Encourage improved lifestyle - low sodium diet, regular exercise 3. Continue monitor BP outside office, bring readings to next visit, if persistently >140/90 or new symptoms notify office sooner 4. Follow-up

## 2022-10-05 NOTE — Progress Notes (Signed)
Subjective:    Patient ID: Suzanne Garrison, female    DOB: 09-Mar-1971, 52 y.o.   MRN: [id number]  Suzanne Garrison is a 52 y.o. female presenting on 10/05/2022 for New Patient (Initial Visit), Establish Care, Cyst (More painful than normal), and Hypertension (746pm 114/69/422pm 120/78/753pm laying down 133/82)  Here to establish care with new PCP here. Previous PCP not covered by insurance.  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 Asking about perimenopausal Mother went through menopause at age 9 Mammogram previously   CHRONIC HTN: Reports concern with lower ankle swelling. Prior history Amlodipine dose 2.5 up to 5mg  Previously Lisinopril cough > Losartan > Amlodipine  Home BP readings  Current Meds - Amlodipine 5mg  daily   Reports good compliance, took meds today. Tolerating well, w/o complaints. Denies CP, dyspnea, HA, edema, dizziness / lightheadedness   Fam History of Thyroid TSH previously normal   Has had cataracts   Health Maintenance:  UTD Mammogram 08/02/22 [facility] [place], negative. Documented dense tissue fibrocystic changes  Due for Colon CA Screening - will re order Cologuard. No prior colonoscopy.     10/05/2022   10:09 AM 02/03/2019    3:12 PM 09/21/2017   10:24 AM  Depression screen PHQ 2/9  Decreased Interest 0 0 0  Down, Depressed, Hopeless 0 0 0  PHQ - 2 Score 0 0 0    Past Medical History:  Diagnosis Date   Allergy    Fibrocystic breast disease    History of abnormal Pap smear    Hypercholesterolemia    Past Surgical History:  Procedure Laterality Date   CATARACT EXTRACTION  09/2017   Social History   Socioeconomic History   Marital status: Divorced    Spouse name: Not on file   Number of children: 0   Years of education: Not on file   Highest education level: Not on file  Occupational  History    Employer: [employer]  Tobacco Use   Smoking status: Never   Smokeless tobacco: Never  Vaping Use   Vaping Use: Never used  Substance and Sexual Activity   Alcohol use: Yes    Alcohol/week: 1.0 standard drink of alcohol    Types: 1 Shots of liquor per week    Comment: occasional   Drug use: No   Sexual activity: Not on file  Other Topics Concern   Not on file  Social History Narrative   Sales executive   Social Determinants of Health   Financial Resource Strain: Not on file  Food Insecurity: Not on file  Transportation Needs: Not on file  Physical Activity: Not on file  Stress: Not on file  Social Connections: Not on file  Intimate Partner Violence: Not on file   Family History  Problem Relation Age of Onset   Thyroid disease Mother    Thyroid disease Maternal Grandmother    Thyroid disease Maternal Grandfather    Alcohol abuse Paternal Grandmother    COPD Paternal Grandmother    Alcohol abuse Paternal Grandfather    Cancer Paternal Grandfather        lung   Colon cancer Neg Hx    Breast cancer Neg Hx    Current Outpatient Medications on File Prior to Visit  Medication Sig   amLODipine (NORVASC) 5 MG tablet Take 5 mg by mouth daily.   COLLAGEN  PO Take by mouth.   MAGNESIUM PO Take by mouth.   Multiple Vitamin (MULTIVITAMIN) tablet Take 1 tablet by mouth daily.   Omega-3 Fatty Acids (FISH OIL PO) Take by mouth.   Vitamin D-Vitamin K (K2 PLUS D3 PO) Take by mouth.   No current facility-administered medications on file prior to visit.    Review of Systems Per HPI unless specifically indicated above      Objective:    BP 134/70 Comment: Home BP reading  Pulse 85   Ht 5\' 2"  (1.575 m)   Wt 142 lb (64.4 kg)   SpO2 94%   BMI 25.97 kg/m   Wt Readings from Last 3 Encounters:  10/05/22 142 lb (64.4 kg)  08/29/19 132 lb 12.8 oz (60.2 kg)  02/03/19 164 lb 3.2 oz (74.5 kg)    Physical Exam Vitals and nursing note reviewed.   Constitutional:      General: She is not in acute distress.    Appearance: Normal appearance. She is well-developed. She is not diaphoretic.     Comments: Well-appearing, comfortable, cooperative  HENT:     Head: Normocephalic and atraumatic.  Eyes:     General:        Right eye: No discharge.        Left eye: No discharge.     Conjunctiva/sclera: Conjunctivae normal.  Cardiovascular:     Rate and Rhythm: Normal rate.  Pulmonary:     Effort: Pulmonary effort is normal.  Skin:    General: Skin is warm and dry.     Findings: No erythema or rash.  Neurological:     Mental Status: She is alert and oriented to person, place, and time.  Psychiatric:        Mood and Affect: Mood normal.        Behavior: Behavior normal.        Thought Content: Thought content normal.     Comments: Well groomed, good eye contact, normal speech and thoughts     I have personally reviewed the radiology report from 08/02/22 Mammogram.  CLINICAL DATA:  Screening.   EXAM: DIGITAL SCREENING BILATERAL MAMMOGRAM WITH TOMOSYNTHESIS AND CAD   TECHNIQUE: Bilateral screening digital craniocaudal and mediolateral oblique mammograms were obtained. Bilateral screening digital breast tomosynthesis was performed. The images were evaluated with computer-aided detection.   COMPARISON:  Previous exam(s).   ACR Breast Density Category c: The breasts are heterogeneously dense, which may obscure small masses.   FINDINGS: There are no findings suspicious for malignancy.   IMPRESSION: No mammographic evidence of malignancy. A result letter of this screening mammogram will be mailed directly to the patient.   RECOMMENDATION: Screening mammogram in one year. (Code:[id number])   BI-RADS CATEGORY  1: Negative.     Electronically Signed   By: Sande Brothers M.D.   On: 08/04/2022 09:55  Results for orders placed or performed in visit on 10/05/22  HM HIV SCREENING LAB  Result Value Ref Range   HM HIV  Screening Negative - Patient reported       Assessment & Plan:   Problem List Items Addressed This Visit     Essential hypertension - Primary    Home BP readings controlled. Initial elevated BP reading here No known complications  Prior failure Lisinopril 10mg  (cough ACEi), has been on Losartan temporarily in past. Unsure reason off.   Plan:  1. Continue current BP regimen Amlodipine 5mg  daily - discussed may have mild ankle swelling from this, but has been  on for years, not significant swelling concern. Discussed alternative options limited. 2. Encourage improved lifestyle - low sodium diet, regular exercise 3. Continue monitor BP outside office, bring readings to next visit, if persistently >140/90 or new symptoms notify office sooner 4. Follow-up       Relevant Medications   amLODipine (NORVASC) 5 MG tablet   Other Visit Diagnoses     Encounter to establish care with new doctor       Screening for colon cancer       Relevant Orders   Cologuard       Establish care Review prior PCP records  Future physical and lab planning  Orders Placed This Encounter  Procedures   [facility] HIV SCREENING LAB    This external order was created through the Results Console.   Cologuard     No orders of the defined types were placed in this encounter.     Follow up plan: Return in about 4 weeks (around 11/02/2022) for 3-4 weeks fasting lab only 1st in AM, then 1 week later Annual Physical Friday AM preferred.  Future labs 11/03/22 CMET, CBC, A1c, Lipid, Thyroid panel, Vitamin D, Hep C   Saralyn Pilar, DO [facility] 10/05/2022, 10:14 AM

## 2022-11-02 ENCOUNTER — Other Ambulatory Visit: Payer: 59

## 2022-11-03 ENCOUNTER — Other Ambulatory Visit: Payer: 59

## 2022-11-10 DIAGNOSIS — Z1211 Encounter for screening for malignant neoplasm of colon: Secondary | ICD-10-CM | POA: Diagnosis not present

## 2022-11-14 LAB — COLOGUARD: COLOGUARD: NEGATIVE

## 2022-11-20 ENCOUNTER — Other Ambulatory Visit: Payer: 59

## 2022-11-20 DIAGNOSIS — I10 Essential (primary) hypertension: Secondary | ICD-10-CM | POA: Diagnosis not present

## 2022-11-20 DIAGNOSIS — E7800 Pure hypercholesterolemia, unspecified: Secondary | ICD-10-CM | POA: Diagnosis not present

## 2022-11-20 DIAGNOSIS — Z0000 Encounter for general adult medical examination without abnormal findings: Secondary | ICD-10-CM

## 2022-11-20 DIAGNOSIS — E559 Vitamin D deficiency, unspecified: Secondary | ICD-10-CM | POA: Diagnosis not present

## 2022-11-20 DIAGNOSIS — Z1159 Encounter for screening for other viral diseases: Secondary | ICD-10-CM | POA: Diagnosis not present

## 2022-11-20 DIAGNOSIS — R7309 Other abnormal glucose: Secondary | ICD-10-CM | POA: Diagnosis not present

## 2022-11-21 LAB — CBC WITH DIFFERENTIAL/PLATELET
Absolute Monocytes: 429 {cells}/uL (ref 200–950)
Basophils Absolute: 47 {cells}/uL (ref 0–200)
Basophils Relative: 0.6 %
Eosinophils Absolute: 94 {cells}/uL (ref 15–500)
Eosinophils Relative: 1.2 %
HCT: 39.2 % (ref 35.0–45.0)
Hemoglobin: 13 g/dL (ref 11.7–15.5)
Lymphs Abs: 1544 {cells}/uL (ref 850–3900)
MCH: 32.3 pg (ref 27.0–33.0)
MCHC: 33.2 g/dL (ref 32.0–36.0)
MCV: 97.3 fL (ref 80.0–100.0)
MPV: 10.6 fL (ref 7.5–12.5)
Monocytes Relative: 5.5 %
Neutro Abs: 5686 {cells}/uL (ref 1500–7800)
Neutrophils Relative %: 72.9 %
Platelets: 280 10*3/uL (ref 140–400)
RBC: 4.03 Million/uL (ref 3.80–5.10)
RDW: 12.4 % (ref 11.0–15.0)
Total Lymphocyte: 19.8 %
WBC: 7.8 10*3/uL (ref 3.8–10.8)

## 2022-11-21 LAB — COMPLETE METABOLIC PANEL WITH GFR
AG Ratio: 2 (calc) (ref 1.0–2.5)
ALT: 11 U/L (ref 6–29)
AST: 9 U/L — ABNORMAL LOW (ref 10–35)
Albumin: 4.5 g/dL (ref 3.6–5.1)
Alkaline phosphatase (APISO): 64 U/L (ref 37–153)
BUN: 12 mg/dL (ref 7–25)
CO2: 26 mmol/L (ref 20–32)
Calcium: 9.2 mg/dL (ref 8.6–10.4)
Chloride: 104 mmol/L (ref 98–110)
Creat: 0.68 mg/dL (ref 0.50–1.03)
Globulin: 2.3 g/dL (calc) (ref 1.9–3.7)
Glucose, Bld: 91 mg/dL (ref 65–99)
Potassium: 4.1 mmol/L (ref 3.5–5.3)
Sodium: 139 mmol/L (ref 135–146)
Total Bilirubin: 0.8 mg/dL (ref 0.2–1.2)
Total Protein: 6.8 g/dL (ref 6.1–8.1)
eGFR: 105 mL/min/{1.73_m2} (ref >=60)

## 2022-11-21 LAB — THYROID PANEL WITH TSH
Free Thyroxine Index: 1.9 (ref 1.4–3.8)
T3 Uptake: 27 % (ref 22–35)
T4, Total: 6.9 ug/dL (ref 5.1–11.9)
TSH: 2.33 mIU/L

## 2022-11-21 LAB — LIPID PANEL
Cholesterol: 228 mg/dL — ABNORMAL HIGH (ref <200)
HDL: 81 mg/dL (ref >=50)
LDL Cholesterol (Calc): 124 mg/dL (calc) — ABNORMAL HIGH
Non-HDL Cholesterol (Calc): 147 mg/dL (calc) — ABNORMAL HIGH (ref <130)
Total CHOL/HDL Ratio: 2.8 (calc) (ref <5.0)
Triglycerides: 119 mg/dL (ref <150)

## 2022-11-21 LAB — HEMOGLOBIN A1C
Hgb A1c MFr Bld: 5 %{Hb}
Mean Plasma Glucose: 97 mg/dL
eAG (mmol/L): 5.4 mmol/L

## 2022-11-21 LAB — VITAMIN D 25 HYDROXY (VIT D DEFICIENCY, FRACTURES): Vit D, 25-Hydroxy: 76 ng/mL (ref 30–100)

## 2022-11-21 LAB — HEPATITIS C ANTIBODY: Hepatitis C Ab: NONREACTIVE

## 2022-11-22 ENCOUNTER — Other Ambulatory Visit: Payer: 59

## 2022-11-24 ENCOUNTER — Encounter: Payer: 59 | Admitting: Family Medicine

## 2022-11-27 ENCOUNTER — Ambulatory Visit (INDEPENDENT_AMBULATORY_CARE_PROVIDER_SITE_OTHER): Payer: 59 | Admitting: Family Medicine

## 2022-11-27 ENCOUNTER — Encounter: Payer: Self-pay | Admitting: Family Medicine

## 2022-11-27 VITALS — BP 130/72 | HR 74 | Temp 98.6°F | Resp 18 | Ht 62.0 in | Wt 149.4 lb

## 2022-11-27 DIAGNOSIS — Z0000 Encounter for general adult medical examination without abnormal findings: Secondary | ICD-10-CM | POA: Diagnosis not present

## 2022-11-27 DIAGNOSIS — E7800 Pure hypercholesterolemia, unspecified: Secondary | ICD-10-CM | POA: Diagnosis not present

## 2022-11-27 DIAGNOSIS — E559 Vitamin D deficiency, unspecified: Secondary | ICD-10-CM | POA: Diagnosis not present

## 2022-11-27 DIAGNOSIS — I10 Essential (primary) hypertension: Secondary | ICD-10-CM

## 2022-11-27 MED ORDER — AMLODIPINE BESYLATE 5 MG PO TABS: 5.0000 mg | ORAL_TABLET | Freq: Every day | ORAL | 3 refills | Status: DC

## 2022-11-27 NOTE — Patient Instructions (Addendum)
Thank you for coming to the office today.  Very mild elevated Triglyceride, still in normal range. Okay to stop Fish Oil omega 3 Okay to keep the Vit D K2 Omega 3 that is a low amount and no problem.  Other labs look good. No significant concerns.  Rx Amlodipine 5mg  daily 90 day with refills sent.  Mammogram looks good.   DUE for FASTING BLOOD WORK (no food or drink after midnight before the lab appointment, only water or coffee without cream/sugar on the morning of)  SCHEDULE "Lab Only" visit in the morning at the clinic for lab draw in 1 YEAR  - Make sure Lab Only appointment is at about 1 week before your next appointment, so that results will be available  For Lab Results, once available within 2-3 days of blood draw, you can can log in to MyChart online to view your results and a brief explanation. Also, we can discuss results at next follow-up visit.   Please schedule a Follow-up Appointment to: Return in about 1 year (around 11/27/2023) for 1 year fasting lab only then 1 week later Annual Physical.  If you have any other questions or concerns, please feel free to call the office or send a message through MyChart. You may also schedule an earlier appointment if necessary.  Additionally, you may be receiving a survey about your experience at our office within a few days to 1 week by e-mail or mail. We value your feedback.  Saralyn Pilar, DO [facility], [place]

## 2022-11-27 NOTE — Progress Notes (Unsigned)
Subjective:    Patient ID: Suzanne Garrison, female    DOB: [birth year], 52 y.o.   MRN: [id number]  Suzanne Garrison is a 52 y.o. female presenting on 11/27/2022 for Annual Exam   HPI  Here for Annual Physical and Lab Review  Fibrocystic Cysts, Breasts, bilateral Reports episodic flaring up with menstrual cycle She is spotting more often now. Asking about perimenopausal Mother went through menopause at age 74   CHRONIC HTN: Doing well. No swelling. Home BP readings  Current Meds - Amlodipine 5mg  daily   Reports good compliance, took meds today. Tolerating well, w/o complaints. Denies CP, dyspnea, HA, edema, dizziness / lightheadedness   HYPERLIPIDEMIA: - Reports no concerns. Last lipid panel 11/20/22, with mild elevated TG but still normal 110s - Currently taking Omega 3 Fish Oil, and questioning if she should continue - She does take a Vitamin D3 and K2 and Omega 3 250mg , and she will plan to stop the Fish Oil Lifestyle - Diet: balanced    Fam History of Thyroid TSH previously normal  Health Maintenance:  Future TDap tetanus vaccine  Decline Shingrix  Colon CA - Cologuard negative 11/10/22 - good for 3 years.      11/27/2022    3:49 PM 10/05/2022   10:09 AM 02/03/2019    3:12 PM  Depression screen PHQ 2/9  Decreased Interest 0 0 0  Down, Depressed, Hopeless 0 0 0  PHQ - 2 Score 0 0 0  Altered sleeping 0    Tired, decreased energy 0    Change in appetite 0    Feeling bad or failure about yourself  0    Trouble concentrating 0    Moving slowly or fidgety/restless 0    Suicidal thoughts 0    PHQ-9 Score 0    Difficult doing work/chores Not difficult at all      Past Medical History:  Diagnosis Date   Allergy    Fibrocystic breast disease    History of abnormal Pap smear    Hypercholesterolemia    Past Surgical History:  Procedure Laterality Date   CATARACT EXTRACTION  09/2017   Social History   Socioeconomic History   Marital status: Divorced     Spouse name: Not on file   Number of children: 0   Years of education: Not on file   Highest education level: Not on file  Occupational History    Employer: [employer]  Tobacco Use   Smoking status: Never   Smokeless tobacco: Never  Vaping Use   Vaping Use: Never used  Substance and Sexual Activity   Alcohol use: Yes    Alcohol/week: 1.0 standard drink of alcohol    Types: 1 Shots of liquor per week    Comment: occasional   Drug use: No   Sexual activity: Not on file  Other Topics Concern   Not on file  Social History Narrative   Sales executive   Social Determinants of Health   Financial Resource Strain: Not on file  Food Insecurity: Not on file  Transportation Needs: Not on file  Physical Activity: Not on file  Stress: Not on file  Social Connections: Not on file  Intimate Partner Violence: Not on file   Family History  Problem Relation Age of Onset   Thyroid disease Mother    Thyroid disease Maternal Grandmother    Thyroid disease Maternal Grandfather    Alcohol abuse Paternal Grandmother    COPD Paternal Grandmother  Alcohol abuse Paternal Grandfather    Cancer Paternal Grandfather        lung   Colon cancer Neg Hx    Breast cancer Neg Hx    Current Outpatient Medications on File Prior to Visit  Medication Sig   COLLAGEN PO Take by mouth.   MAGNESIUM PO Take by mouth.   Multiple Vitamin (MULTIVITAMIN) tablet Take 1 tablet by mouth daily.   Vitamin D-Vitamin K (K2 PLUS D3 PO) Take by mouth.   No current facility-administered medications on file prior to visit.    Review of Systems  Constitutional:  Negative for activity change, appetite change, chills, diaphoresis, fatigue and fever.  HENT:  Negative for congestion and hearing loss.   Eyes:  Negative for visual disturbance.  Respiratory:  Negative for cough, chest tightness, shortness of breath and wheezing.   Cardiovascular:  Negative for chest pain, palpitations and leg swelling.   Gastrointestinal:  Negative for abdominal pain, constipation, diarrhea, nausea and vomiting.  Genitourinary:  Negative for dysuria, frequency and hematuria.  Musculoskeletal:  Negative for arthralgias and neck pain.  Skin:  Negative for rash.  Neurological:  Negative for dizziness, weakness, light-headedness, numbness and headaches.  Hematological:  Negative for adenopathy.  Psychiatric/Behavioral:  Negative for behavioral problems, dysphoric mood and sleep disturbance.    Per HPI unless specifically indicated above      Objective:    BP 130/72 (BP Location: Left Arm, Patient Position: Sitting, Cuff Size: Normal)   Pulse 74   Temp 98.6 F (37 C) (Oral)   Resp 18   Ht 5\' 2"  (1.575 m)   Wt 149 lb 6.4 oz (67.8 kg)   SpO2 97%   BMI 27.33 kg/m   Wt Readings from Last 3 Encounters:  11/27/22 149 lb 6.4 oz (67.8 kg)  10/05/22 142 lb (64.4 kg)  08/29/19 132 lb 12.8 oz (60.2 kg)    Physical Exam Vitals and nursing note reviewed.  Constitutional:      General: She is not in acute distress.    Appearance: She is well-developed. She is not diaphoretic.     Comments: Well-appearing, comfortable, cooperative  HENT:     Head: Normocephalic and atraumatic.  Eyes:     General:        Right eye: No discharge.        Left eye: No discharge.     Conjunctiva/sclera: Conjunctivae normal.     Pupils: Pupils are equal, round, and reactive to light.  Neck:     Thyroid: No thyromegaly.  Cardiovascular:     Rate and Rhythm: Normal rate and regular rhythm.     Pulses: Normal pulses.     Heart sounds: Normal heart sounds. No murmur heard. Pulmonary:     Effort: Pulmonary effort is normal. No respiratory distress.     Breath sounds: Normal breath sounds. No wheezing or rales.  Abdominal:     General: Bowel sounds are normal. There is no distension.     Palpations: Abdomen is soft. There is no mass.     Tenderness: There is no abdominal tenderness.  Musculoskeletal:        General: No  tenderness. Normal range of motion.     Cervical back: Normal range of motion and neck supple.     Comments: Upper / Lower Extremities: - Normal muscle tone, strength bilateral upper extremities 5/5, lower extremities 5/5  Lymphadenopathy:     Cervical: No cervical adenopathy.  Skin:    General: Skin is warm  and dry.     Findings: No erythema or rash.  Neurological:     Mental Status: She is alert and oriented to person, place, and time.     Comments: Distal sensation intact to light touch all extremities  Psychiatric:        Mood and Affect: Mood normal.        Behavior: Behavior normal.        Thought Content: Thought content normal.     Comments: Well groomed, good eye contact, normal speech and thoughts    Results for orders placed or performed in visit on 11/20/22  VITAMIN D 25 Hydroxy (Vit-D Deficiency, Fractures)  Result Value Ref Range   Vit D, 25-Hydroxy 76 30 - 100 ng/mL  Thyroid Panel With TSH  Result Value Ref Range   T3 Uptake 27 22 - 35 %   T4, Total 6.9 5.1 - 11.9 mcg/dL   Free Thyroxine Index 1.9 1.4 - 3.8   TSH 2.33 mIU/L  Hepatitis C antibody  Result Value Ref Range   Hepatitis C Ab NON-REACTIVE NON-REACTIVE  Lipid panel  Result Value Ref Range   Cholesterol 228 (H) <200 mg/dL   HDL 81 > OR = 50 mg/dL   Triglycerides 161 <096 mg/dL   LDL Cholesterol (Calc) 124 (H) mg/dL (calc)   Total CHOL/HDL Ratio 2.8 <5.0 (calc)   Non-HDL Cholesterol (Calc) 147 (H) <130 mg/dL (calc)  Hemoglobin E4V  Result Value Ref Range   Hgb A1c MFr Bld 5.0 <5.7 % of total Hgb   Mean Plasma Glucose 97 mg/dL   eAG (mmol/L) 5.4 mmol/L  CBC with Differential/Platelet  Result Value Ref Range   WBC 7.8 3.8 - 10.8 Thousand/uL   RBC 4.03 3.80 - 5.10 Million/uL   Hemoglobin 13.0 11.7 - 15.5 g/dL   HCT 40.9 81.1 - 91.4 %   MCV 97.3 80.0 - 100.0 fL   MCH 32.3 27.0 - 33.0 pg   MCHC 33.2 32.0 - 36.0 g/dL   RDW 78.2 95.6 - 21.3 %   Platelets 280 140 - 400 Thousand/uL   MPV 10.6 7.5 -  12.5 fL   Neutro Abs 5,686 1,500 - 7,800 cells/uL   Lymphs Abs 1,544 850 - 3,900 cells/uL   Absolute Monocytes 429 200 - 950 cells/uL   Eosinophils Absolute 94 15 - 500 cells/uL   Basophils Absolute 47 0 - 200 cells/uL   Neutrophils Relative % 72.9 %   Total Lymphocyte 19.8 %   Monocytes Relative 5.5 %   Eosinophils Relative 1.2 %   Basophils Relative 0.6 %  COMPLETE METABOLIC PANEL WITH GFR  Result Value Ref Range   Glucose, Bld 91 65 - 99 mg/dL   BUN 12 7 - 25 mg/dL   Creat 0.86 5.78 - 4.69 mg/dL   eGFR 629 > OR = 60 BM/WUX/3.24M0   BUN/Creatinine Ratio SEE NOTE: 6 - 22 (calc)   Sodium 139 135 - 146 mmol/L   Potassium 4.1 3.5 - 5.3 mmol/L   Chloride 104 98 - 110 mmol/L   CO2 26 20 - 32 mmol/L   Calcium 9.2 8.6 - 10.4 mg/dL   Total Protein 6.8 6.1 - 8.1 g/dL   Albumin 4.5 3.6 - 5.1 g/dL   Globulin 2.3 1.9 - 3.7 g/dL (calc)   AG Ratio 2.0 1.0 - 2.5 (calc)   Total Bilirubin 0.8 0.2 - 1.2 mg/dL   Alkaline phosphatase (APISO) 64 37 - 153 U/L   AST 9 (L) 10 - 35 U/L  ALT 11 6 - 29 U/L      Assessment & Plan:   Problem List Items Addressed This Visit     Essential hypertension    Controlled No known complications  Prior failure Lisinopril 10mg  (cough ACEi), has been on Losartan temporarily in past. Unsure reason off.   Plan:  1. Continue current BP regimen Amlodipine 5mg  daily - 2. 2. Encourage improved lifestyle - low sodium diet, regular exercise 3. Continue monitor BP outside office, bring readings to next visit, if persistently >140/90 or new symptoms notify office sooner 4. Follow-up       Relevant Medications   amLODipine (NORVASC) 5 MG tablet   Hypercholesterolemia    Elevated LDL, and TG normal but prior elevated Not on statin The 10-year ASCVD risk score (Arnett DK, et al., 2019) is: 1.6%  Plan: Goal to limit excess dietary carb starch sugar Okay to stop Fish Oil omega 3 Okay to keep the Vit D K2 Omega 3 that is a low amount and no problem.  Encourage  improved lifestyle - low carb/cholesterol, reduce portion size, continue improving regular exercise      Relevant Medications   amLODipine (NORVASC) 5 MG tablet   Other Visit Diagnoses     Annual physical exam    -  Primary   Vitamin D deficiency           Updated Health Maintenance information Reviewed recent lab results with patient Encouraged improvement to lifestyle with diet and exercise Goal of weight loss  Mammogram looks good.  Continue Vitamin D   Meds ordered this encounter  Medications   amLODipine (NORVASC) 5 MG tablet    Sig: Take 1 tablet (5 mg total) by mouth daily.    Dispense:  90 tablet    Refill:  3     Follow up plan: Return in about 1 year (around 11/27/2023) for 1 year fasting lab only then 1 week later Annual Physical.  Future labs ordered 11/26/23  Saralyn Pilar, DO [facility] 11/27/2022, 4:21 PM

## 2022-11-28 ENCOUNTER — Other Ambulatory Visit: Payer: Self-pay | Admitting: Family Medicine

## 2022-11-28 DIAGNOSIS — E559 Vitamin D deficiency, unspecified: Secondary | ICD-10-CM

## 2022-11-28 DIAGNOSIS — R7309 Other abnormal glucose: Secondary | ICD-10-CM

## 2022-11-28 DIAGNOSIS — I10 Essential (primary) hypertension: Secondary | ICD-10-CM

## 2022-11-28 DIAGNOSIS — Z0000 Encounter for general adult medical examination without abnormal findings: Secondary | ICD-10-CM

## 2022-11-28 DIAGNOSIS — E7800 Pure hypercholesterolemia, unspecified: Secondary | ICD-10-CM

## 2022-11-28 NOTE — Assessment & Plan Note (Signed)
Elevated LDL, and TG normal but prior elevated Not on statin The 10-year ASCVD risk score (Arnett DK, et al., 2019) is: 1.6%  Plan: Goal to limit excess dietary carb starch sugar Okay to stop Fish Oil omega 3 Okay to keep the Vit D K2 Omega 3 that is a low amount and no problem.  Encourage improved lifestyle - low carb/cholesterol, reduce portion size, continue improving regular exercise

## 2022-11-28 NOTE — Assessment & Plan Note (Signed)
Controlled No known complications  Prior failure Lisinopril 10mg  (cough ACEi), has been on Losartan temporarily in past. Unsure reason off.   Plan:  1. Continue current BP regimen Amlodipine 5mg  daily - 2. 2. Encourage improved lifestyle - low sodium diet, regular exercise 3. Continue monitor BP outside office, bring readings to next visit, if persistently >140/90 or new symptoms notify office sooner 4. Follow-up

## 2023-04-18 DIAGNOSIS — D225 Melanocytic nevi of trunk: Secondary | ICD-10-CM | POA: Diagnosis not present

## 2023-04-18 DIAGNOSIS — D2262 Melanocytic nevi of left upper limb, including shoulder: Secondary | ICD-10-CM | POA: Diagnosis not present

## 2023-04-18 DIAGNOSIS — D2261 Melanocytic nevi of right upper limb, including shoulder: Secondary | ICD-10-CM | POA: Diagnosis not present

## 2023-04-18 DIAGNOSIS — L578 Other skin changes due to chronic exposure to nonionizing radiation: Secondary | ICD-10-CM | POA: Diagnosis not present

## 2023-04-18 DIAGNOSIS — L821 Other seborrheic keratosis: Secondary | ICD-10-CM | POA: Diagnosis not present

## 2023-04-18 DIAGNOSIS — D485 Neoplasm of uncertain behavior of skin: Secondary | ICD-10-CM | POA: Diagnosis not present

## 2023-05-28 ENCOUNTER — Other Ambulatory Visit: Payer: 59

## 2023-06-04 ENCOUNTER — Encounter: Payer: 59 | Admitting: Family Medicine

## 2023-10-02 DIAGNOSIS — Z124 Encounter for screening for malignant neoplasm of cervix: Secondary | ICD-10-CM | POA: Diagnosis not present

## 2023-10-02 DIAGNOSIS — Z01419 Encounter for gynecological examination (general) (routine) without abnormal findings: Secondary | ICD-10-CM | POA: Diagnosis not present

## 2023-11-26 ENCOUNTER — Other Ambulatory Visit: Payer: Self-pay

## 2023-12-03 ENCOUNTER — Encounter: Payer: Self-pay | Admitting: Family Medicine

## 2023-12-28 ENCOUNTER — Encounter: Payer: Self-pay | Admitting: Family Medicine

## 2023-12-28 ENCOUNTER — Ambulatory Visit (INDEPENDENT_AMBULATORY_CARE_PROVIDER_SITE_OTHER): Admitting: Family Medicine

## 2023-12-28 VITALS — BP 132/84 | HR 82 | Ht 62.0 in | Wt 155.1 lb

## 2023-12-28 DIAGNOSIS — Z0000 Encounter for general adult medical examination without abnormal findings: Secondary | ICD-10-CM | POA: Diagnosis not present

## 2023-12-28 DIAGNOSIS — R7309 Other abnormal glucose: Secondary | ICD-10-CM

## 2023-12-28 DIAGNOSIS — Z1231 Encounter for screening mammogram for malignant neoplasm of breast: Secondary | ICD-10-CM | POA: Diagnosis not present

## 2023-12-28 DIAGNOSIS — E7800 Pure hypercholesterolemia, unspecified: Secondary | ICD-10-CM

## 2023-12-28 DIAGNOSIS — E559 Vitamin D deficiency, unspecified: Secondary | ICD-10-CM

## 2023-12-28 DIAGNOSIS — I10 Essential (primary) hypertension: Secondary | ICD-10-CM | POA: Diagnosis not present

## 2023-12-28 MED ORDER — AMLODIPINE BESYLATE 5 MG PO TABS: 5.0000 mg | ORAL_TABLET | Freq: Every day | ORAL | 3 refills | Status: AC

## 2023-12-28 NOTE — Patient Instructions (Addendum)
 Thank you for coming to the office today.   Labs today  We will get copy of the pap smear from [facility]  For Mammogram screening for breast cancer   Call the Imaging Center below anytime to schedule your own appointment now that order has been placed.  [facility] at [facility] [address], [address] [place], [place] [address] Phone: [phone]  Future reconsider Pneumonia vaccine Prevnar-20 and Shingrix  Please schedule a Follow-up Appointment to: Return in about 1 year (around 12/27/2024) for 1 year fasting lab > 1 week later Annual Physical.  If you have any other questions or concerns, please feel free to call the office or send a message through MyChart. You may also schedule an earlier appointment if necessary.  Additionally, you may be receiving a survey about your experience at our office within a few days to 1 week by e-mail or mail. We value your feedback.  Marsa Officer, DO [facility], [place]

## 2023-12-28 NOTE — Progress Notes (Signed)
 Subjective:    Patient ID: Suzanne Garrison, female    DOB: April 19, 1971, 53 y.o.   MRN: [id number]  Suzanne Garrison is a 53 y.o. female presenting on 12/28/2023 for Annual Exam   HPI  Discussed the use of AI scribe software for clinical note transcription with the patient, who gave verbal consent to proceed.  History of Present Illness   Suzanne Garrison is a 53 year old female who presents for an annual physical exam.   Fibrocystic Cysts, Breasts, bilateral   CHRONIC HTN: Home BP 126/85 earlier today. She has averaged 110s systolic and some 120s, lower reading often 85+, occasional 90.l She admits drinking coffee can trigger. Doing well. No swelling. Home BP readings  Current Meds - Amlodipine  5mg  daily   Reports good compliance, took meds today. Tolerating well, w/o complaints. Denies CP, dyspnea, HA, edema, dizziness / lightheadedness   HYPERLIPIDEMIA: - Reports no concerns. Last lipid panel 11/20/22, with mild elevated TG but still normal 110s Due for lipid panel today - Currently taking Omega 3 Fish Oil, and questioning if she should continue - She does take a Vitamin D3 and K2 and Omega 3 250mg , and she will plan to stop the Fish Oil Lifestyle - Diet: balanced Father has history of hyperlipidemia. No history of heart disease.   Fam History of Thyroid  TSH previously normal    Health Maintenance:  Flu shot with work in the future   Future TDap tetanus vaccine   Decline Shingrix  Last Pap Smear updated [facility] 2025   Colon CA - Cologuard negative 11/10/22 - good for 3 years.      12/28/2023    8:07 AM 11/27/2022    3:49 PM 10/05/2022   10:09 AM  Depression screen PHQ 2/9  Decreased Interest 0 0 0  Down, Depressed, Hopeless 0 0 0  PHQ - 2 Score 0 0 0  Altered sleeping 0 0   Tired, decreased energy 0 0   Change in appetite 0 0   Feeling bad or failure about yourself  0 0   Trouble concentrating 0 0   Moving slowly or  fidgety/restless 0 0   Suicidal thoughts 0 0   PHQ-9 Score 0 0   Difficult doing work/chores  Not difficult at all        12/28/2023    8:07 AM 11/27/2022    3:50 PM 10/05/2022   10:09 AM  GAD 7 : Generalized Anxiety Score  Nervous, Anxious, on Edge 0 0 0  Control/stop worrying 0 0 0  Worry too much - different things 0 0 0  Trouble relaxing 0 0 0  Restless 0 0 0  Easily annoyed or irritable 0 0 0  Afraid - awful might happen 0 0 0  Total GAD 7 Score 0 0 0  Anxiety Difficulty  Not difficult at all      Past Medical History:  Diagnosis Date   Allergy    Fibrocystic breast disease    History of abnormal Pap smear    Hypercholesterolemia    Past Surgical History:  Procedure Laterality Date   CATARACT EXTRACTION  09/2017   Social History   Socioeconomic History   Marital status: Divorced    Spouse name: Not on file   Number of children: 0   Years of education: Not on file   Highest education level: Associate degree: occupational, Scientist, product/process development, or vocational program  Occupational History    Employer: [employer]  Tobacco Use   Smoking status: Never   Smokeless tobacco: Never  Vaping Use   Vaping status: Never Used  Substance and Sexual Activity   Alcohol use: Yes    Alcohol/week: 1.0 standard drink of alcohol    Types: 1 Shots of liquor per week    Comment: occasional   Drug use: No   Sexual activity: Not on file  Other Topics Concern   Not on file  Social History Narrative   Sales executive   Social Drivers of Health   Financial Resource Strain: Low Risk  (12/24/2023)   Overall Financial Resource Strain (CARDIA)    Difficulty of Paying Living Expenses: Not very hard  Food Insecurity: No Food Insecurity (12/24/2023)   Hunger Vital Sign    Worried About Running Out of Food in the Last Year: Never true    Ran Out of Food in the Last Year: Never true  Transportation Needs: No Transportation Needs (12/24/2023)   PRAPARE - Scientist, research (physical sciences) (Medical): No    Lack of Transportation (Non-Medical): No  Physical Activity: Insufficiently Active (12/24/2023)   Exercise Vital Sign    Days of Exercise per Week: 1 day    Minutes of Exercise per Session: 20 min  Stress: No Stress Concern Present (12/24/2023)   [employer] of Occupational Health - Occupational Stress Questionnaire    Feeling of Stress: Not at all  Social Connections: Socially Isolated (12/24/2023)   Social Connection and Isolation Panel    Frequency of Communication with Friends and Family: Three times a week    Frequency of Social Gatherings with Friends and Family: Once a week    Attends Religious Services: Never    Database administrator or Organizations: No    Attends Engineer, structural: Not on file    Marital Status: Divorced  Catering manager Violence: Not on file   Family History  Problem Relation Age of Onset   Thyroid  disease Mother    Hyperlipidemia Father    Thyroid  disease Maternal Grandmother    Thyroid  disease Maternal Grandfather    Alcohol abuse Paternal Grandmother    COPD Paternal Grandmother    Alcohol abuse Paternal Grandfather    Cancer Paternal Grandfather        lung   Colon cancer Neg Hx    Breast cancer Neg Hx    Current Outpatient Medications on File Prior to Visit  Medication Sig   COLLAGEN PO Take by mouth.   MAGNESIUM PO Take by mouth.   Multiple Vitamin (MULTIVITAMIN) tablet Take 1 tablet by mouth daily.   Vitamin D -Vitamin K (K2 PLUS D3 PO) Take by mouth.   No current facility-administered medications on file prior to visit.    Review of Systems  Constitutional:  Negative for activity change, appetite change, chills, diaphoresis, fatigue and fever.  HENT:  Negative for congestion and hearing loss.   Eyes:  Negative for visual disturbance.  Respiratory:  Negative for cough, chest tightness, shortness of breath and wheezing.   Cardiovascular:  Negative for chest pain, palpitations and leg  swelling.  Gastrointestinal:  Negative for abdominal pain, constipation, diarrhea, nausea and vomiting.  Genitourinary:  Negative for dysuria, frequency and hematuria.  Musculoskeletal:  Negative for arthralgias and neck pain.  Skin:  Negative for rash.  Neurological:  Negative for dizziness, weakness, light-headedness, numbness and headaches.  Hematological:  Negative for adenopathy.  Psychiatric/Behavioral:  Negative for behavioral problems, dysphoric mood and sleep  disturbance.    Per HPI unless specifically indicated above     Objective:    BP 132/84 (BP Location: Right Arm, Patient Position: Sitting, Cuff Size: Normal)   Pulse 82   Ht 5' 2 (1.575 m)   Wt 155 lb 2 oz (70.4 kg)   SpO2 98%   BMI 28.37 kg/m   Wt Readings from Last 3 Encounters:  12/28/23 155 lb 2 oz (70.4 kg)  11/27/22 149 lb 6.4 oz (67.8 kg)  10/05/22 142 lb (64.4 kg)    Physical Exam Vitals and nursing note reviewed.  Constitutional:      General: She is not in acute distress.    Appearance: She is well-developed. She is not diaphoretic.     Comments: Well-appearing, comfortable, cooperative  HENT:     Head: Normocephalic and atraumatic.  Eyes:     General:        Right eye: No discharge.        Left eye: No discharge.     Conjunctiva/sclera: Conjunctivae normal.     Pupils: Pupils are equal, round, and reactive to light.  Neck:     Thyroid : No thyromegaly.  Cardiovascular:     Rate and Rhythm: Normal rate and regular rhythm.     Pulses: Normal pulses.     Heart sounds: Normal heart sounds. No murmur heard. Pulmonary:     Effort: Pulmonary effort is normal. No respiratory distress.     Breath sounds: Normal breath sounds. No wheezing or rales.  Abdominal:     General: Bowel sounds are normal. There is no distension.     Palpations: Abdomen is soft. There is no mass.     Tenderness: There is no abdominal tenderness.  Musculoskeletal:        General: No tenderness. Normal range of motion.      Cervical back: Normal range of motion and neck supple.     Right lower leg: Edema (+trace ankle edema) present.     Left lower leg: Edema (+trace ankle edema) present.     Comments: Upper / Lower Extremities: - Normal muscle tone, strength bilateral upper extremities 5/5, lower extremities 5/5  Lymphadenopathy:     Cervical: No cervical adenopathy.  Skin:    General: Skin is warm and dry.     Findings: No erythema or rash.  Neurological:     Mental Status: She is alert and oriented to person, place, and time.     Comments: Distal sensation intact to light touch all extremities  Psychiatric:        Mood and Affect: Mood normal.        Behavior: Behavior normal.        Thought Content: Thought content normal.     Comments: Well groomed, good eye contact, normal speech and thoughts     Results for orders placed or performed in visit on 11/20/22  VITAMIN D  25 Hydroxy (Vit-D Deficiency, Fractures)   Collection Time: 11/20/22  9:28 AM  Result Value Ref Range   Vit D, 25-Hydroxy 76 30 - 100 ng/mL  Thyroid  Panel With TSH   Collection Time: 11/20/22  9:28 AM  Result Value Ref Range   T3 Uptake 27 22 - 35 %   T4, Total 6.9 5.1 - 11.9 mcg/dL   Free Thyroxine Index 1.9 1.4 - 3.8   TSH 2.33 mIU/L  Hepatitis C antibody   Collection Time: 11/20/22  9:28 AM  Result Value Ref Range   Hepatitis C Ab NON-REACTIVE  NON-REACTIVE  Lipid panel   Collection Time: 11/20/22  9:28 AM  Result Value Ref Range   Cholesterol 228 (H) <200 mg/dL   HDL 81 > OR = 50 mg/dL   Triglycerides 880 <849 mg/dL   LDL Cholesterol (Calc) 124 (H) mg/dL (calc)   Total CHOL/HDL Ratio 2.8 <5.0 (calc)   Non-HDL Cholesterol (Calc) 147 (H) <130 mg/dL (calc)  Hemoglobin J8r   Collection Time: 11/20/22  9:28 AM  Result Value Ref Range   Hgb A1c MFr Bld 5.0 <5.7 % of total Hgb   Mean Plasma Glucose 97 mg/dL   eAG (mmol/L) 5.4 mmol/L  CBC with Differential/Platelet   Collection Time: 11/20/22  9:28 AM  Result Value Ref  Range   WBC 7.8 3.8 - 10.8 Thousand/uL   RBC 4.03 3.80 - 5.10 Million/uL   Hemoglobin 13.0 11.7 - 15.5 g/dL   HCT 60.7 64.9 - 54.9 %   MCV 97.3 80.0 - 100.0 fL   MCH 32.3 27.0 - 33.0 pg   MCHC 33.2 32.0 - 36.0 g/dL   RDW 87.5 88.9 - 84.9 %   Platelets 280 140 - 400 Thousand/uL   MPV 10.6 7.5 - 12.5 fL   Neutro Abs 5,686 1,500 - 7,800 cells/uL   Lymphs Abs 1,544 850 - 3,900 cells/uL   Absolute Monocytes 429 200 - 950 cells/uL   Eosinophils Absolute 94 15 - 500 cells/uL   Basophils Absolute 47 0 - 200 cells/uL   Neutrophils Relative % 72.9 %   Total Lymphocyte 19.8 %   Monocytes Relative 5.5 %   Eosinophils Relative 1.2 %   Basophils Relative 0.6 %  COMPLETE METABOLIC PANEL WITH GFR   Collection Time: 11/20/22  9:28 AM  Result Value Ref Range   Glucose, Bld 91 65 - 99 mg/dL   BUN 12 7 - 25 mg/dL   Creat 9.31 9.49 - 8.96 mg/dL   eGFR 894 > OR = 60 fO/fpw/8.26f7   BUN/Creatinine Ratio SEE NOTE: 6 - 22 (calc)   Sodium 139 135 - 146 mmol/L   Potassium 4.1 3.5 - 5.3 mmol/L   Chloride 104 98 - 110 mmol/L   CO2 26 20 - 32 mmol/L   Calcium 9.2 8.6 - 10.4 mg/dL   Total Protein 6.8 6.1 - 8.1 g/dL   Albumin 4.5 3.6 - 5.1 g/dL   Globulin 2.3 1.9 - 3.7 g/dL (calc)   AG Ratio 2.0 1.0 - 2.5 (calc)   Total Bilirubin 0.8 0.2 - 1.2 mg/dL   Alkaline phosphatase (APISO) 64 37 - 153 U/L   AST 9 (L) 10 - 35 U/L   ALT 11 6 - 29 U/L      Assessment & Plan:   Problem List Items Addressed This Visit     Essential hypertension   Relevant Medications   amLODipine  (NORVASC ) 5 MG tablet   Other Relevant Orders   CBC with Differential/Platelet   Comprehensive metabolic panel with GFR   Hypercholesterolemia   Relevant Medications   amLODipine  (NORVASC ) 5 MG tablet   Other Relevant Orders   TSH   Lipid panel   Comprehensive metabolic panel with GFR   CT CARDIAC SCORING (SELF PAY ONLY)   Other Visit Diagnoses       Annual physical exam    -  Primary   Relevant Orders   TSH   Lipid  panel   Hemoglobin A1c   CBC with Differential/Platelet   Comprehensive metabolic panel with GFR     Vitamin D   deficiency         Abnormal glucose       Relevant Orders   Hemoglobin A1c     Encounter for screening mammogram for malignant neoplasm of breast       Relevant Orders   MM 3D SCREENING MAMMOGRAM BILATERAL BREAST        Updated Health Maintenance information Fasting labs ordered. Pending today Encouraged improvement to lifestyle with diet and exercise Goal of weight loss  Hypertension Well controlled with amlodipine  5 mg. Occasional blood pressure elevations possibly due to caffeine. Mild edema likely medication-related. - Renew amlodipine  5 mg for one year. - Advise reducing caffeine intake.  Hyperlipidemia with cardiovascular risk assessment Hyperlipidemia with low 10-year cardiovascular risk at 2%. Discussed coronary calcium scan for further risk assessment. - Order lipid panel. - Order coronary calcium scan. - Discontinue fish oil supplement.  Adult Wellness Visit Annual wellness visit completed. Routine screenings and vaccinations discussed. - Order comprehensive blood panel. - Document flu vaccination date. - Consider tetanus booster. - Discussed pneumonia vaccination (Prevnar 20) for ages 68-65. - Discussed optional COVID booster. - Recommended shingles vaccination.  General Health Maintenance Routine health maintenance discussed. Pap smear up to date. Mammogram pending. - Schedule mammogram. - Obtain recent Pap smear from [facility].         Orders Placed This Encounter  Procedures   MM 3D SCREENING MAMMOGRAM BILATERAL BREAST    Standing Status:   Future    Expiration Date:   12/27/2024    Reason for Exam (SYMPTOM  OR DIAGNOSIS REQUIRED):   Screening bilateral 3D Mammogram Tomo    Preferred imaging location?:   [facility]   CT CARDIAC SCORING (SELF PAY ONLY)    Standing Status:   Future    Expiration Date:   12/27/2024    Is patient  pregnant?:   No    Preferred imaging location?:   [facility]   TSH   Lipid panel    Has the patient fasted?:   Yes   Hemoglobin A1c   CBC with Differential/Platelet   Comprehensive metabolic panel with GFR    Has the patient fasted?:   Yes    Meds ordered this encounter  Medications   amLODipine  (NORVASC ) 5 MG tablet    Sig: Take 1 tablet (5 mg total) by mouth daily.    Dispense:  90 tablet    Refill:  3     Follow up plan: Return in about 1 year (around 12/27/2024) for 1 year fasting lab > 1 week later Annual Physical.  Marsa Officer, DO [facility] 12/28/2023, 8:27 AM

## 2023-12-29 LAB — TSH: TSH: 2.22 m[IU]/L

## 2023-12-29 LAB — COMPREHENSIVE METABOLIC PANEL WITH GFR
AG Ratio: 1.9 (calc) (ref 1.0–2.5)
ALT: 16 U/L (ref 6–29)
AST: 16 U/L (ref 10–35)
Albumin: 4.6 g/dL (ref 3.6–5.1)
Alkaline phosphatase (APISO): 85 U/L (ref 37–153)
BUN: 14 mg/dL (ref 7–25)
CO2: 27 mmol/L (ref 20–32)
Calcium: 9.1 mg/dL (ref 8.6–10.4)
Chloride: 102 mmol/L (ref 98–110)
Creat: 0.65 mg/dL (ref 0.50–1.03)
Globulin: 2.4 g/dL (ref 1.9–3.7)
Glucose, Bld: 88 mg/dL (ref 65–99)
Potassium: 4.2 mmol/L (ref 3.5–5.3)
Sodium: 138 mmol/L (ref 135–146)
Total Bilirubin: 0.5 mg/dL (ref 0.2–1.2)
Total Protein: 7 g/dL (ref 6.1–8.1)
eGFR: 105 mL/min/1.73m2 (ref >=60)

## 2023-12-29 LAB — CBC WITH DIFFERENTIAL/PLATELET
Absolute Lymphocytes: 1499 {cells}/uL (ref 850–3900)
Absolute Monocytes: 421 {cells}/uL (ref 200–950)
Basophils Absolute: 57 {cells}/uL (ref 0–200)
Basophils Relative: 0.7 %
Eosinophils Absolute: 1077 {cells}/uL — ABNORMAL HIGH (ref 15–500)
Eosinophils Relative: 13.3 %
HCT: 41.3 % (ref 35.0–45.0)
Hemoglobin: 13.7 g/dL (ref 11.7–15.5)
MCH: 31.9 pg (ref 27.0–33.0)
MCHC: 33.2 g/dL (ref 32.0–36.0)
MCV: 96 fL (ref 80.0–100.0)
MPV: 10.4 fL (ref 7.5–12.5)
Monocytes Relative: 5.2 %
Neutro Abs: 5046 {cells}/uL (ref 1500–7800)
Neutrophils Relative %: 62.3 %
Platelets: 293 Thousand/uL (ref 140–400)
RBC: 4.3 Million/uL (ref 3.80–5.10)
RDW: 12.9 % (ref 11.0–15.0)
Total Lymphocyte: 18.5 %
WBC: 8.1 Thousand/uL (ref 3.8–10.8)

## 2023-12-29 LAB — LIPID PANEL
Cholesterol: 259 mg/dL — ABNORMAL HIGH (ref <200)
HDL: 81 mg/dL (ref >=50)
LDL Cholesterol (Calc): 159 mg/dL — ABNORMAL HIGH
Non-HDL Cholesterol (Calc): 178 mg/dL — ABNORMAL HIGH (ref <130)
Total CHOL/HDL Ratio: 3.2 (calc) (ref <5.0)
Triglycerides: 82 mg/dL (ref <150)

## 2023-12-29 LAB — HEMOGLOBIN A1C
Hgb A1c MFr Bld: 5.1 % (ref <5.7)
Mean Plasma Glucose: 100 mg/dL
eAG (mmol/L): 5.5 mmol/L

## 2023-12-31 ENCOUNTER — Ambulatory Visit: Payer: Self-pay | Admitting: Family Medicine

## 2025-01-02 ENCOUNTER — Encounter: Admitting: Family Medicine
